# Patient Record
Sex: Female | Born: 1990 | Hispanic: No | Marital: Single | Smoking: Never smoker
Health system: Southern US, Community
[De-identification: ages and names within clinical notes are randomized; demographics above are authoritative.]

## PROBLEM LIST (undated history)

## (undated) DIAGNOSIS — J45909 Unspecified asthma, uncomplicated: Secondary | ICD-10-CM

## (undated) HISTORY — PX: NO PAST SURGERIES: SHX2092

## (undated) HISTORY — DX: Unspecified asthma, uncomplicated: J45.909

---
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
## 2017-08-14 DIAGNOSIS — Z6833 Body mass index (BMI) 33.0-33.9, adult: Secondary | ICD-10-CM | POA: Diagnosis not present

## 2017-08-14 DIAGNOSIS — Z118 Encounter for screening for other infectious and parasitic diseases: Secondary | ICD-10-CM | POA: Diagnosis not present

## 2017-08-14 DIAGNOSIS — Z01419 Encounter for gynecological examination (general) (routine) without abnormal findings: Secondary | ICD-10-CM | POA: Diagnosis not present

## 2017-08-14 DIAGNOSIS — N915 Oligomenorrhea, unspecified: Secondary | ICD-10-CM | POA: Diagnosis not present

## 2017-08-14 DIAGNOSIS — Z13220 Encounter for screening for lipoid disorders: Secondary | ICD-10-CM | POA: Diagnosis not present

## 2017-08-14 DIAGNOSIS — Z1329 Encounter for screening for other suspected endocrine disorder: Secondary | ICD-10-CM | POA: Diagnosis not present

## 2017-08-14 DIAGNOSIS — Z113 Encounter for screening for infections with a predominantly sexual mode of transmission: Secondary | ICD-10-CM | POA: Diagnosis not present

## 2017-08-14 DIAGNOSIS — E282 Polycystic ovarian syndrome: Secondary | ICD-10-CM | POA: Diagnosis not present

## 2017-08-28 DIAGNOSIS — H5213 Myopia, bilateral: Secondary | ICD-10-CM | POA: Diagnosis not present

## 2019-02-28 LAB — OB RESULTS CONSOLE HEPATITIS B SURFACE ANTIGEN: Hepatitis B Surface Ag: NEGATIVE

## 2019-02-28 LAB — OB RESULTS CONSOLE RUBELLA ANTIBODY, IGM: Rubella: IMMUNE

## 2019-05-14 DIAGNOSIS — Z3490 Encounter for supervision of normal pregnancy, unspecified, unspecified trimester: Secondary | ICD-10-CM | POA: Insufficient documentation

## 2019-05-15 ENCOUNTER — Ambulatory Visit (INDEPENDENT_AMBULATORY_CARE_PROVIDER_SITE_OTHER): Payer: Medicaid Other | Admitting: Obstetrics and Gynecology

## 2019-05-15 ENCOUNTER — Other Ambulatory Visit: Payer: Self-pay

## 2019-05-15 ENCOUNTER — Encounter: Payer: Self-pay | Admitting: Obstetrics and Gynecology

## 2019-05-15 VITALS — BP 110/69 | HR 89 | Wt 190.0 lb

## 2019-05-15 DIAGNOSIS — Z3490 Encounter for supervision of normal pregnancy, unspecified, unspecified trimester: Secondary | ICD-10-CM

## 2019-05-15 DIAGNOSIS — Z3A21 21 weeks gestation of pregnancy: Secondary | ICD-10-CM

## 2019-05-15 DIAGNOSIS — Z3402 Encounter for supervision of normal first pregnancy, second trimester: Secondary | ICD-10-CM

## 2019-05-15 DIAGNOSIS — Z3492 Encounter for supervision of normal pregnancy, unspecified, second trimester: Secondary | ICD-10-CM

## 2019-05-15 DIAGNOSIS — Z3482 Encounter for supervision of other normal pregnancy, second trimester: Secondary | ICD-10-CM

## 2019-05-15 DIAGNOSIS — R87612 Low grade squamous intraepithelial lesion on cytologic smear of cervix (LGSIL): Secondary | ICD-10-CM

## 2019-05-15 MED ORDER — BLOOD PRESSURE KIT DEVI: 1.0000 | 0 refills | Status: AC

## 2019-05-15 NOTE — Progress Notes (Signed)
Subjective:  Carmen Meza is a 28 y.o. G1P0 at [redacted]w[redacted]d being seen today for ongoing prenatal care. Transferred from [facility] d/t insurance reasons. EDD by LMP and confirmed by first trimester U/S. No chronic medical problems or medications.  She is currently monitored for the following issues for this low-risk pregnancy and has Supervision of normal pregnancy, antepartum and LGSIL of cervix of undetermined significance on their problem list.  Patient reports no complaints.  Contractions: Not present. Vag. Bleeding: None.  Movement: Present. Denies leaking of fluid.   The following portions of the patient's history were reviewed and updated as appropriate: allergies, current medications, past family history, past medical history, past social history, past surgical history and problem list. Problem list updated.  Objective:   Vitals:   05/15/19 1104  BP: 110/69  Pulse: 89  Weight: 190 lb (86.2 kg)    Fetal Status:     Movement: Present     General:  Alert, oriented and cooperative. Patient is in no acute distress.  Skin: Skin is warm and dry. No rash noted.   Cardiovascular: Normal heart rate noted  Respiratory: Normal respiratory effort, no problems with respiration noted  Abdomen: Soft, gravid, appropriate for gestational age. Pain/Pressure: Absent     Pelvic:  Cervical exam deferred        Extremities: Normal range of motion.  Edema: None  Mental Status: Normal mood and affect. Normal behavior. Normal judgment and thought content.   Urinalysis:      Assessment and Plan:  Pregnancy: G1P0 at [redacted]w[redacted]d  1. Encounter for supervision of normal pregnancy, antepartum, unspecified gravidity Prenatal care reviewed with. Prenatal labs from [place] reviewed. Do not see blood type and ABS, will obtain today Anatomy scan ordered Declined flu vaccine AFP today Normal NT at [facility] scripts and BP monitoring discussed with pt.   2. LGSIL of cervix of undetermined  significance HPV negative, repeat with co testing in 1 yr  Preterm labor symptoms and general obstetric precautions including but not limited to vaginal bleeding, contractions, leaking of fluid and fetal movement were reviewed in detail with the patient. Please refer to After Visit Summary for other counseling recommendations.  Return in about 5 weeks (around 06/19/2019) for OB visit, fasting appt for glucola.   Chancy Milroy, MD

## 2019-05-15 NOTE — Patient Instructions (Signed)

## 2019-05-15 NOTE — Addendum Note (Signed)
Addended by: Cleotilde Neer on: 05/15/2019 01:13 PM   Modules accepted: Orders

## 2019-05-17 LAB — ANTIBODY SCREEN: Antibody Screen: NEGATIVE

## 2019-05-17 LAB — AFP, SERUM, OPEN SPINA BIFIDA
AFP MoM: 1.34
AFP Value: 78.5 ng/mL
Gest. Age on Collection Date: 21.3 weeks
Maternal Age At EDD: 27.2 yr
OSBR Risk 1 IN: 4273
Test Results:: NEGATIVE
Weight: 190 [lb_av]

## 2019-06-06 ENCOUNTER — Ambulatory Visit (HOSPITAL_COMMUNITY): Payer: Medicaid Other | Admitting: *Deleted

## 2019-06-06 ENCOUNTER — Encounter (HOSPITAL_COMMUNITY): Payer: Self-pay

## 2019-06-06 ENCOUNTER — Ambulatory Visit (HOSPITAL_COMMUNITY)
Admission: RE | Admit: 2019-06-06 | Discharge: 2019-06-06 | Disposition: A | Discharge status: Home / Self Care | Payer: Medicaid Other | Source: Ambulatory Visit | Attending: Obstetrics and Gynecology | Admitting: Obstetrics and Gynecology

## 2019-06-06 ENCOUNTER — Other Ambulatory Visit: Payer: Self-pay

## 2019-06-06 ENCOUNTER — Ambulatory Visit (HOSPITAL_COMMUNITY): Payer: Medicaid Other

## 2019-06-06 VITALS — BP 113/63 | HR 79 | Temp 97.7°F | Ht 64.0 in

## 2019-06-06 DIAGNOSIS — Z3490 Encounter for supervision of normal pregnancy, unspecified, unspecified trimester: Secondary | ICD-10-CM | POA: Insufficient documentation

## 2019-06-06 DIAGNOSIS — O99212 Obesity complicating pregnancy, second trimester: Secondary | ICD-10-CM | POA: Diagnosis not present

## 2019-06-06 DIAGNOSIS — Z3A24 24 weeks gestation of pregnancy: Secondary | ICD-10-CM | POA: Diagnosis not present

## 2019-06-06 DIAGNOSIS — O0932 Supervision of pregnancy with insufficient antenatal care, second trimester: Secondary | ICD-10-CM | POA: Diagnosis not present

## 2019-06-06 DIAGNOSIS — O0990 Supervision of high risk pregnancy, unspecified, unspecified trimester: Secondary | ICD-10-CM | POA: Insufficient documentation

## 2019-06-07 NOTE — L&D Delivery Note (Addendum)
OB/GYN Faculty Practice Delivery Note  Carmen Meza is a 29 y.o. G1P0 s/p SVd at [redacted]w[redacted]d. She was admitted for IOL for post dates.  ROM: 8h 47m with clear fluid GBS Status:  Negative/-- (03/24 1037) Maximum Maternal Temperature: 100.19F  Labor Progress: . Initial SVE: 0.5/thick/-3. She was given FB, cytotec and pitocin. She was then AROM'd on 4/26 @ 1425 and IUPC inserted. She was given epidural for pain relief. During labor, she developed intrapartum Triple I and received abx. She then progressed to complete.   Delivery Date/Time: 09/30/19 at 2256 Delivery: Called to room and patient was complete and pushing. Head delivered LOA. No nuchal cord present. Shoulder and body delivered in usual fashion. Infant with spontaneous cry, placed on mother's abdomen, dried and stimulated. Cord clamped x 2 after 1-minute delay, and cut by FOB. Cord blood drawn. Placenta delivered spontaneously with gentle cord traction. Fundus firm with massage and Pitocin. Labia, perineum, vagina, and cervix inspected inspected with labial and periurethral lacerations that were sutured with 4-0 Vicryl in the usual fashion with hemostasis. Baby Weight: pending  Placenta: Sent to L&D Complications: None Lacerations: periurethral and right labial EBL: 650 mL Analgesia: Epidural   Infant:  APGAR (1 MIN): 7   APGAR (5 MINS): [address] DO, PGY1 09/30/2019, 11:17 PM  OB FELLOW DELIVERY ATTESTATION  I was gloved and present for the delivery in its entirety, and I agree with the above resident's note.    Carmen Birkenhead, MD [facility] Family Medicine Fellow, [facility] for [employer], [facility]

## 2019-06-19 ENCOUNTER — Telehealth (INDEPENDENT_AMBULATORY_CARE_PROVIDER_SITE_OTHER): Payer: Medicaid Other

## 2019-06-19 ENCOUNTER — Other Ambulatory Visit: Payer: Medicaid Other

## 2019-06-19 DIAGNOSIS — Z3403 Encounter for supervision of normal first pregnancy, third trimester: Secondary | ICD-10-CM | POA: Diagnosis not present

## 2019-06-19 DIAGNOSIS — Z3A26 26 weeks gestation of pregnancy: Secondary | ICD-10-CM

## 2019-06-19 DIAGNOSIS — Z3400 Encounter for supervision of normal first pregnancy, unspecified trimester: Secondary | ICD-10-CM

## 2019-06-19 DIAGNOSIS — Z3009 Encounter for other general counseling and advice on contraception: Secondary | ICD-10-CM

## 2019-06-19 NOTE — Progress Notes (Signed)
ROB Virtual.   CC: None

## 2019-06-19 NOTE — Patient Instructions (Signed)
Glucose Tolerance Test During Pregnancy Why am I having this test? The glucose tolerance test (GTT) is done to check how your body processes sugar (glucose). This is one of several tests used to diagnose diabetes that develops during pregnancy (gestational diabetes mellitus). Gestational diabetes is a temporary form of diabetes that some women develop during pregnancy. It usually occurs during the second trimester of pregnancy and goes away after delivery. Testing (screening) for gestational diabetes usually occurs between 24 and 28 weeks of pregnancy. You may have the GTT test after having a 1-hour glucose screening test if the results from that test indicate that you may have gestational diabetes. You may also have this test if:  You have a history of gestational diabetes.  You have a history of giving birth to very large babies or have experienced repeated fetal loss (stillbirth).  You have signs and symptoms of diabetes, such as: ? Changes in your vision. ? Tingling or numbness in your hands or feet. ? Changes in hunger, thirst, and urination that are not otherwise explained by your pregnancy. What is being tested? This test measures the amount of glucose in your blood at different times during a period of 3 hours. This indicates how well your body is able to process glucose. What kind of sample is taken?  Blood samples are required for this test. They are usually collected by inserting a needle into a blood vessel. How do I prepare for this test?  For 3 days before your test, eat normally. Have plenty of carbohydrate-rich foods.  Follow instructions from your health care provider about: ? Eating or drinking restrictions on the day of the test. You may be asked to not eat or drink anything other than water (fast) starting 8-10 hours before the test. ? Changing or stopping your regular medicines. Some medicines may interfere with this test. Tell a health care provider about:  All  medicines you are taking, including vitamins, herbs, eye drops, creams, and over-the-counter medicines.  Any blood disorders you have.  Any surgeries you have had.  Any medical conditions you have. What happens during the test? First, your blood glucose will be measured. This is referred to as your fasting blood glucose, since you fasted before the test. Then, you will drink a glucose solution that contains a certain amount of glucose. Your blood glucose will be measured again 1, 2, and 3 hours after drinking the solution. This test takes about 3 hours to complete. You will need to stay at the testing location during this time. During the testing period:  Do not eat or drink anything other than the glucose solution.  Do not exercise.  Do not use any products that contain nicotine or tobacco, such as cigarettes and e-cigarettes. If you need help stopping, ask your health care provider. The testing procedure may vary among health care providers and hospitals. How are the results reported? Your results will be reported as milligrams of glucose per deciliter of blood (mg/dL) or millimoles per liter (mmol/L). Your health care provider will compare your results to normal ranges that were established after testing a large group of people (reference ranges). Reference ranges may vary among labs and hospitals. For this test, common reference ranges are:  Fasting: less than 95-105 mg/dL (5.3-5.8 mmol/L).  1 hour after drinking glucose: less than 180-190 mg/dL (10.0-10.5 mmol/L).  2 hours after drinking glucose: less than 155-165 mg/dL (8.6-9.2 mmol/L).  3 hours after drinking glucose: 140-145 mg/dL (7.8-8.1 mmol/L). What do the   results mean? Results within reference ranges are considered normal, meaning that your glucose levels are well-controlled. If two or more of your blood glucose levels are high, you may be diagnosed with gestational diabetes. If only one level is high, your health care  provider may suggest repeat testing or other tests to confirm a diagnosis. Talk with your health care provider about what your results mean. Questions to ask your health care provider Ask your health care provider, or the department that is doing the test:  When will my results be ready?  How will I get my results?  What are my treatment options?  What other tests do I need?  What are my next steps? Summary  The glucose tolerance test (GTT) is one of several tests used to diagnose diabetes that develops during pregnancy (gestational diabetes mellitus). Gestational diabetes is a temporary form of diabetes that some women develop during pregnancy.  You may have the GTT test after having a 1-hour glucose screening test if the results from that test indicate that you may have gestational diabetes. You may also have this test if you have any symptoms or risk factors for gestational diabetes.  Talk with your health care provider about what your results mean. This information is not intended to replace advice given to you by your health care provider. Make sure you discuss any questions you have with your health care provider. Document Revised: 09/13/2018 Document Reviewed: 01/02/2017 Elsevier Patient Education  2020 Elsevier Inc.  

## 2019-06-19 NOTE — Progress Notes (Signed)
   TELEHEALTH VIRTUAL OBSTETRICS VISIT ENCOUNTER NOTE  I connected with Carmen Meza on 06/19/19 at  9:30 AM EST by telephone at home and verified that I am speaking with the correct person using two identifiers.   I discussed the limitations, risks, security and privacy concerns of performing an evaluation and management service by telephone and the availability of in person appointments. I also discussed with the patient that there may be a patient responsible charge related to this service. The patient expressed understanding and agreed to proceed.  Subjective:  Carmen Meza is a 29 y.o. G1P0 at [redacted]w[redacted]d being followed for ongoing prenatal care.  She is currently monitored for the following issues for this low-risk pregnancy and has Supervision of normal pregnancy, antepartum and LGSIL of cervix of undetermined significance on their problem list.  Patient reports no complaints. Reports fetal movement. Denies any contractions, bleeding or leaking of fluid.   The following portions of the patient's history were reviewed and updated as appropriate: allergies, current medications, past family history, past medical history, past social history, past surgical history and problem list.   Objective:   General:  Alert, oriented and cooperative.   Mental Status: Normal mood and affect perceived. Normal judgment and thought content.  Rest of physical exam deferred due to type of encounter  BP: 116/86 Assessment and Plan:  Pregnancy: G1P0 at [redacted]w[redacted]d  1. Encounter for supervision of normal first pregnancy in second trimester -Anticipatory guidance for upcoming appt.  -Reviewed Glucola appt preparation including fasting the night before and morning of.   -Discussed anticipated office time of 2.5-3 hours.  -Reviewed blood draw procedures and labs which also include check of iron level.  -Discussed how results of GTT are handled including diabetic education and BS testing for abnormal results  and routine care for normal results.  -Patient encouraged to record blood pressures into babyscripts for appropriate monitoring, notification, and management.  2. Encounter for counseling regarding contraception -States she took pills for one year with bleeding, cramping, and mood swings. -Patient reports that she does not desire birth control method after delivery.  -Expresses desire for more children, but unable to quantify.    Preterm labor symptoms and general obstetric precautions including but not limited to vaginal bleeding, contractions, leaking of fluid and fetal movement were reviewed in detail with the patient.   I discussed the assessment and treatment plan with the patient. The patient was provided an opportunity to ask questions and all were answered. The patient agreed with the plan and demonstrated an understanding of the instructions. The patient was advised to call back or seek an in-person office evaluation/go to [facility]U at [facility] for any urgent or concerning symptoms. Please refer to After Visit Summary for other counseling recommendations.   I provided 9 minutes of non-face-to-face time during this encounter.  Return in about 2 weeks (around 07/03/2019) for LR-ROB in office with GTT.  No future appointments.  Cherre Robins, CNM Center for [employer], [facility]

## 2019-06-20 ENCOUNTER — Encounter: Payer: Self-pay | Admitting: Family Medicine

## 2019-06-20 DIAGNOSIS — O99210 Obesity complicating pregnancy, unspecified trimester: Secondary | ICD-10-CM | POA: Insufficient documentation

## 2019-07-03 ENCOUNTER — Other Ambulatory Visit: Payer: Medicaid Other

## 2019-07-03 ENCOUNTER — Ambulatory Visit (INDEPENDENT_AMBULATORY_CARE_PROVIDER_SITE_OTHER): Payer: Medicaid Other | Admitting: Obstetrics

## 2019-07-03 ENCOUNTER — Other Ambulatory Visit: Payer: Self-pay

## 2019-07-03 ENCOUNTER — Encounter: Payer: Self-pay | Admitting: Obstetrics

## 2019-07-03 DIAGNOSIS — M549 Dorsalgia, unspecified: Secondary | ICD-10-CM

## 2019-07-03 DIAGNOSIS — Z3400 Encounter for supervision of normal first pregnancy, unspecified trimester: Secondary | ICD-10-CM

## 2019-07-03 DIAGNOSIS — Z3A28 28 weeks gestation of pregnancy: Secondary | ICD-10-CM

## 2019-07-03 DIAGNOSIS — Z3403 Encounter for supervision of normal first pregnancy, third trimester: Secondary | ICD-10-CM

## 2019-07-03 MED ORDER — COMFORT FIT MATERNITY SUPP SM MISC: 0 refills | Status: DC

## 2019-07-03 NOTE — Addendum Note (Signed)
Addended by: Thom Chimes on: 07/03/2019 11:40 AM   Modules accepted: Orders

## 2019-07-03 NOTE — Progress Notes (Signed)
Subjective:  Carmen Meza is a 29 y.o. G1P0 at [redacted]w[redacted]d being seen today for ongoing prenatal care.  She is currently monitored for the following issues for this low-risk pregnancy and has Supervision of normal pregnancy, antepartum; LGSIL of cervix of undetermined significance; and Obesity affecting pregnancy, antepartum on their problem list.  Patient reports backache.  Contractions: Not present.  .  Movement: Present. Denies leaking of fluid.   The following portions of the patient's history were reviewed and updated as appropriate: allergies, current medications, past family history, past medical history, past social history, past surgical history and problem list. Problem list updated.  Objective:  There were no vitals filed for this visit.  Fetal Status:     Movement: Present     General:  Alert, oriented and cooperative. Patient is in no acute distress.  Skin: Skin is warm and dry. No rash noted.   Cardiovascular: Normal heart rate noted  Respiratory: Normal respiratory effort, no problems with respiration noted  Abdomen: Soft, gravid, appropriate for gestational age. Pain/Pressure: Absent     Pelvic:  Cervical exam deferred        Extremities: Normal range of motion.  Edema: None  Mental Status: Normal mood and affect. Normal behavior. Normal judgment and thought content.   Urinalysis:      Assessment and Plan:  Pregnancy: G1P0 at [redacted]w[redacted]d  1. Supervision of normal first pregnancy, antepartum Rx: - Glucose Tolerance, 2 Hours w/1 Hour  2. Backache symptom Rx: - Elastic Bandages & Supports (COMFORT FIT MATERNITY SUPP SM) MISC; Wear as directed  Dispense: 1 each; Refill: 0   Preterm labor symptoms and general obstetric precautions including but not limited to vaginal bleeding, contractions, leaking of fluid and fetal movement were reviewed in detail with the patient. Please refer to After Visit Summary for other counseling recommendations.  No follow-ups on file.   Brock Bad, MD  07/03/2019

## 2019-07-04 LAB — CBC
Hematocrit: 34.8 % (ref 34.0–46.6)
Hemoglobin: 12.1 g/dL (ref 11.1–15.9)
MCH: 31.6 pg (ref 26.6–33.0)
MCHC: 34.8 g/dL (ref 31.5–35.7)
MCV: 91 fL (ref 79–97)
Platelets: 230 10*3/uL (ref 150–450)
RBC: 3.83 x10E6/uL (ref 3.77–5.28)
RDW: 13 % (ref 11.7–15.4)
WBC: 10.9 10*3/uL — ABNORMAL HIGH (ref 3.4–10.8)

## 2019-07-04 LAB — GLUCOSE TOLERANCE, 2 HOURS W/ 1HR
Glucose, 1 hour: 159 mg/dL (ref 65–179)
Glucose, 2 hour: 130 mg/dL (ref 65–152)
Glucose, Fasting: 78 mg/dL (ref 65–91)

## 2019-07-04 LAB — HIV ANTIBODY (ROUTINE TESTING W REFLEX): HIV Screen 4th Generation wRfx: NONREACTIVE

## 2019-07-04 LAB — RPR: RPR Ser Ql: NONREACTIVE

## 2019-07-17 ENCOUNTER — Telehealth (INDEPENDENT_AMBULATORY_CARE_PROVIDER_SITE_OTHER): Payer: Medicaid Other | Admitting: Obstetrics

## 2019-07-17 ENCOUNTER — Encounter: Payer: Self-pay | Admitting: Obstetrics

## 2019-07-17 VITALS — BP 121/82

## 2019-07-17 DIAGNOSIS — O99213 Obesity complicating pregnancy, third trimester: Secondary | ICD-10-CM

## 2019-07-17 DIAGNOSIS — Z3490 Encounter for supervision of normal pregnancy, unspecified, unspecified trimester: Secondary | ICD-10-CM

## 2019-07-17 DIAGNOSIS — Z3A30 30 weeks gestation of pregnancy: Secondary | ICD-10-CM

## 2019-07-17 DIAGNOSIS — O99210 Obesity complicating pregnancy, unspecified trimester: Secondary | ICD-10-CM

## 2019-07-17 NOTE — Progress Notes (Signed)
   TELEHEALTH OBSTETRICS PRENATAL VIRTUAL VIDEO VISIT ENCOUNTER NOTE  Provider location: Center for [facility] [place]   I connected with Carmen Meza on 07/17/19 at  3:00 PM EST by [facility] MyChart Video Encounter at home and verified that I am speaking with the correct person using two identifiers.   I discussed the limitations, risks, security and privacy concerns of performing an evaluation and management service virtually and the availability of in person appointments. I also discussed with the patient that there may be a patient responsible charge related to this service. The patient expressed understanding and agreed to proceed.  Subjective:  Carmen Meza is a 29 y.o. G1P0 at [redacted]w[redacted]d being seen today for ongoing prenatal care.  She is currently monitored for the following issues for this low-risk pregnancy and has Supervision of normal pregnancy, antepartum; LGSIL of cervix of undetermined significance; and Obesity affecting pregnancy, antepartum on their problem list.  Patient reports no complaints.  Contractions: Not present. Vag. Bleeding: None.  Movement: Present. Denies any leaking of fluid.   The following portions of the patient's history were reviewed and updated as appropriate: allergies, current medications, past family history, past medical history, past social history, past surgical history and problem list.   Objective:   Vitals:   07/17/19 1514  BP: 121/82    Fetal Status:     Movement: Present     General:  Alert, oriented and cooperative. Patient is in no acute distress.  Respiratory: Normal respiratory effort, no problems with respiration noted  Mental Status: Normal mood and affect. Normal behavior. Normal judgment and thought content.  Rest of physical exam deferred due to type of encounter  Imaging: No results found.  Assessment and Plan:  Pregnancy: G1P0 at [redacted]w[redacted]d  1. Encounter for supervision of normal pregnancy, antepartum, unspecified  gravidity  2. Obesity affecting pregnancy, antepartum   Preterm labor symptoms and general obstetric precautions including but not limited to vaginal bleeding, contractions, leaking of fluid and fetal movement were reviewed in detail with the patient. I discussed the assessment and treatment plan with the patient. The patient was provided an opportunity to ask questions and all were answered. The patient agreed with the plan and demonstrated an understanding of the instructions. The patient was advised to call back or seek an in-person office evaluation/go to MAU at [facility] for any urgent or concerning symptoms. Please refer to After Visit Summary for other counseling recommendations.   I provided 10 minutes of face-to-face time during this encounter.  Return in about 2 weeks (around 07/31/2019) for MyChart.  Future Appointments  Date Time Provider Department Center  07/31/2019  2:15 PM Brock Bad, MD [facility] None    Coral Ceo, MD Center for [facility], [facility] 07/17/2019

## 2019-07-17 NOTE — Progress Notes (Signed)
Pt is on the phone preparing for virtual visit with provider. [redacted]w[redacted]d.

## 2019-07-31 ENCOUNTER — Encounter: Payer: Self-pay | Admitting: Obstetrics

## 2019-07-31 ENCOUNTER — Telehealth (INDEPENDENT_AMBULATORY_CARE_PROVIDER_SITE_OTHER): Payer: Medicaid Other | Admitting: Obstetrics

## 2019-07-31 VITALS — BP 114/72 | HR 99

## 2019-07-31 DIAGNOSIS — O99210 Obesity complicating pregnancy, unspecified trimester: Secondary | ICD-10-CM | POA: Diagnosis not present

## 2019-07-31 DIAGNOSIS — O282 Abnormal cytological finding on antenatal screening of mother: Secondary | ICD-10-CM | POA: Diagnosis not present

## 2019-07-31 DIAGNOSIS — R87612 Low grade squamous intraepithelial lesion on cytologic smear of cervix (LGSIL): Secondary | ICD-10-CM

## 2019-07-31 DIAGNOSIS — Z3490 Encounter for supervision of normal pregnancy, unspecified, unspecified trimester: Secondary | ICD-10-CM

## 2019-07-31 DIAGNOSIS — Z3A32 32 weeks gestation of pregnancy: Secondary | ICD-10-CM | POA: Diagnosis not present

## 2019-07-31 NOTE — Progress Notes (Signed)
   TELEHEALTH VIRTUAL OBSTETRICS VISIT ENCOUNTER NOTE  I connected with Carmen Meza on 07/31/19 at  2:15 PM EST by telephone at home and verified that I am speaking with the correct person using two identifiers.   I discussed the limitations, risks, security and privacy concerns of performing an evaluation and management service by telephone and the availability of in person appointments. I also discussed with the patient that there may be a patient responsible charge related to this service. The patient expressed understanding and agreed to proceed.  Subjective:  Carmen Meza is a 29 y.o. G1P0 at [redacted]w[redacted]d being followed for ongoing prenatal care.  She is currently monitored for the following issues for this low-risk pregnancy and has Supervision of normal pregnancy, antepartum; LGSIL of cervix of undetermined significance; and Obesity affecting pregnancy, antepartum on their problem list.  Patient reports no complaints. Reports fetal movement. Denies any contractions, bleeding or leaking of fluid.   The following portions of the patient's history were reviewed and updated as appropriate: allergies, current medications, past family history, past medical history, past social history, past surgical history and problem list.   Objective:   General:  Alert, oriented and cooperative.   Mental Status: Normal mood and affect perceived. Normal judgment and thought content.  Rest of physical exam deferred due to type of encounter  Assessment and Plan:  Pregnancy: G1P0 at 107w2d  1. Encounter for supervision of normal pregnancy, antepartum, unspecified gravidity  2. 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 Obesity affecting pregnancy, antepartum   Preterm labor symptoms and general obstetric precautions including but not limited to vaginal bleeding, contractions, leaking of fluid and fetal movement were reviewed in  detail with the patient.  I discussed the assessment and treatment plan with the patient. The patient was provided an opportunity to ask questions and all were answered. The patient agreed with the plan and demonstrated an understanding of the instructions. The patient was advised to call back or seek an in-person office evaluation/go to MAU at [facility] for any urgent or concerning symptoms. Please refer to After Visit Summary for other counseling recommendations.   I provided 10 minutes of non-face-to-face time during this encounter.  Return in about 2 weeks (around 08/14/2019) for TeleHealth ( Virtual Visits don't work ).  Future Appointments  Date Time Provider Department Center  07/31/2019  2:15 PM Brock Bad, MD [facility] None    Coral Ceo, MD Center for [facility], [facility] 07/31/2019

## 2019-07-31 NOTE — Progress Notes (Signed)
S/w pt for virtual visit, pt reports fetal movement, denies pain. 

## 2019-08-14 ENCOUNTER — Encounter: Payer: Self-pay | Admitting: Obstetrics

## 2019-08-14 ENCOUNTER — Telehealth (INDEPENDENT_AMBULATORY_CARE_PROVIDER_SITE_OTHER): Payer: Medicaid Other | Admitting: Obstetrics

## 2019-08-14 DIAGNOSIS — Z3A34 34 weeks gestation of pregnancy: Secondary | ICD-10-CM

## 2019-08-14 DIAGNOSIS — O99213 Obesity complicating pregnancy, third trimester: Secondary | ICD-10-CM

## 2019-08-14 DIAGNOSIS — R87612 Low grade squamous intraepithelial lesion on cytologic smear of cervix (LGSIL): Secondary | ICD-10-CM | POA: Diagnosis not present

## 2019-08-14 DIAGNOSIS — Z3490 Encounter for supervision of normal pregnancy, unspecified, unspecified trimester: Secondary | ICD-10-CM

## 2019-08-14 DIAGNOSIS — M549 Dorsalgia, unspecified: Secondary | ICD-10-CM | POA: Diagnosis not present

## 2019-08-14 DIAGNOSIS — O99210 Obesity complicating pregnancy, unspecified trimester: Secondary | ICD-10-CM

## 2019-08-14 NOTE — Progress Notes (Signed)
   TELEHEALTH VIRTUAL OBSTETRICS VISIT ENCOUNTER NOTE  I connected with Carmen Meza on 08/14/19 at 10:30 AM EST by telephone at home and verified that I am speaking with the correct person using two identifiers.   I discussed the limitations, risks, security and privacy concerns of performing an evaluation and management service by telephone and the availability of in person appointments. I also discussed with the patient that there may be a patient responsible charge related to this service. The patient expressed understanding and agreed to proceed.  Subjective:  Carmen Meza is a 29 y.o. G1P0 at [redacted]w[redacted]d being followed for ongoing prenatal care.  She is currently monitored for the following issues for this low-risk pregnancy and has Supervision of normal pregnancy, antepartum; LGSIL of cervix of undetermined significance; and Obesity affecting pregnancy, antepartum on their problem list.  Patient reports backache. Reports fetal movement. Denies any contractions, bleeding or leaking of fluid.   The following portions of the patient's history were reviewed and updated as appropriate: allergies, current medications, past family history, past medical history, past social history, past surgical history and problem list.   Objective:   General:  Alert, oriented and cooperative.   Mental Status: Normal mood and affect perceived. Normal judgment and thought content.  Rest of physical exam deferred due to type of encounter  Assessment and Plan:  Pregnancy: G1P0 at [redacted]w[redacted]d There are no diagnoses linked to this encounter. Preterm labor symptoms and general obstetric precautions including but not limited to vaginal bleeding, contractions, leaking of fluid and fetal movement were reviewed in detail with the patient.  I discussed the assessment and treatment plan with the patient. The patient was provided an opportunity to ask questions and all were answered. The patient agreed with the plan and  demonstrated an understanding of the instructions. The patient was advised to call back or seek an in-person office evaluation/go to MAU at [facility] for any urgent or concerning symptoms. Please refer to After Visit Summary for other counseling recommendations.   I provided 10 minutes of non-face-to-face time during this encounter.    Coral Ceo, MD Center for [facility], [facility] 08/14/2019

## 2019-08-28 ENCOUNTER — Other Ambulatory Visit: Payer: Self-pay

## 2019-08-28 ENCOUNTER — Other Ambulatory Visit (HOSPITAL_COMMUNITY)
Admission: RE | Admit: 2019-08-28 | Discharge: 2019-08-28 | Disposition: A | Discharge status: Home / Self Care | Payer: Medicaid Other | Source: Ambulatory Visit | Attending: Family Medicine | Admitting: Family Medicine

## 2019-08-28 ENCOUNTER — Ambulatory Visit (INDEPENDENT_AMBULATORY_CARE_PROVIDER_SITE_OTHER): Payer: Medicaid Other | Admitting: Family Medicine

## 2019-08-28 DIAGNOSIS — Z3490 Encounter for supervision of normal pregnancy, unspecified, unspecified trimester: Secondary | ICD-10-CM | POA: Insufficient documentation

## 2019-08-28 DIAGNOSIS — Z3493 Encounter for supervision of normal pregnancy, unspecified, third trimester: Secondary | ICD-10-CM

## 2019-08-28 DIAGNOSIS — Z3A36 36 weeks gestation of pregnancy: Secondary | ICD-10-CM

## 2019-08-28 NOTE — Patient Instructions (Signed)

## 2019-08-28 NOTE — Progress Notes (Signed)
   PRENATAL VISIT NOTE  Subjective:  Carmen Meza is a 29 y.o. G1P0 at [redacted]w[redacted]d being seen today for ongoing prenatal care.  She is currently monitored for the following issues for this low-risk pregnancy and has Supervision of normal pregnancy, antepartum; LGSIL of cervix of undetermined significance; and Obesity affecting pregnancy, antepartum on their problem list.  Patient reports no complaints.  Contractions: Not present. Vag. Bleeding: None.  Movement: Present. Denies leaking of fluid.   The following portions of the patient's history were reviewed and updated as appropriate: allergies, current medications, past family history, past medical history, past social history, past surgical history and problem list.   Objective:   Vitals:   08/28/19 1015  BP: 97/66  Pulse: 89  Weight: 203 lb (92.1 kg)    Fetal Status: Fetal Heart Rate (bpm): 138 Fundal Height: 35 cm Movement: Present  Presentation: Vertex  General:  Alert, oriented and cooperative. Patient is in no acute distress.  Skin: Skin is warm and dry. No rash noted.   Cardiovascular: Normal heart rate noted  Respiratory: Normal respiratory effort, no problems with respiration noted  Abdomen: Soft, gravid, appropriate for gestational age.  Pain/Pressure: Present     Pelvic: Cervical exam performed in the presence of a chaperone Dilation: Closed Effacement (%): 0 Station: Ballotable  Extremities: Normal range of motion.  Edema: None  Mental Status: Normal mood and affect. Normal behavior. Normal judgment and thought content.   Assessment and Plan:  Pregnancy: G1P0 at [redacted]w[redacted]d 1. Encounter for supervision of normal pregnancy, antepartum, unspecified gravidity - 36 week cultures - RTC in 1 week - Girl, breast, none  Preterm labor symptoms and general obstetric precautions including but not limited to vaginal bleeding, contractions, leaking of fluid and fetal movement were reviewed in detail with the patient. Please refer to  After Visit Summary for other counseling recommendations.   Return in about 1 week (around 09/04/2019) for ROB: virtual okay .  No future appointments.  Joselyn Arrow, MD

## 2019-08-28 NOTE — Progress Notes (Signed)
Patient reports fetal movement and pressure, denies contractions. 

## 2019-08-29 LAB — CERVICOVAGINAL ANCILLARY ONLY
Chlamydia: NEGATIVE
Comment: NEGATIVE
Comment: NORMAL
Neisseria Gonorrhea: NEGATIVE

## 2019-08-30 LAB — STREP GP B NAA: Strep Gp B NAA: NEGATIVE

## 2019-09-04 ENCOUNTER — Telehealth (INDEPENDENT_AMBULATORY_CARE_PROVIDER_SITE_OTHER): Payer: Medicaid Other | Admitting: Medical

## 2019-09-04 ENCOUNTER — Encounter: Payer: Self-pay | Admitting: Medical

## 2019-09-04 VITALS — BP 113/76 | HR 88

## 2019-09-04 DIAGNOSIS — O99210 Obesity complicating pregnancy, unspecified trimester: Secondary | ICD-10-CM | POA: Diagnosis not present

## 2019-09-04 DIAGNOSIS — O3433 Maternal care for cervical incompetence, third trimester: Secondary | ICD-10-CM

## 2019-09-04 DIAGNOSIS — Z3490 Encounter for supervision of normal pregnancy, unspecified, unspecified trimester: Secondary | ICD-10-CM

## 2019-09-04 DIAGNOSIS — R87612 Low grade squamous intraepithelial lesion on cytologic smear of cervix (LGSIL): Secondary | ICD-10-CM

## 2019-09-04 DIAGNOSIS — E669 Obesity, unspecified: Secondary | ICD-10-CM

## 2019-09-04 DIAGNOSIS — Z3A37 37 weeks gestation of pregnancy: Secondary | ICD-10-CM

## 2019-09-04 NOTE — Progress Notes (Signed)
Virtual ROB  GBS and GC/CT  Neg on 08/28/19  CC: None  *Pt able to take vitals while on phone.   B/P:113/76  P:88

## 2019-09-04 NOTE — Progress Notes (Signed)
I connected with Carmen Meza on 09/04/19 at 10:55 AM EDT by: MyChart and verified that I am speaking with the correct person using two identifiers.  Patient is located at home and provider is located at [facility].     The purpose of this virtual visit is to provide medical care while limiting exposure to the novel coronavirus. I discussed the limitations, risks, security and privacy concerns of performing an evaluation and management service by MyChart and the availability of in person appointments. I also discussed with the patient that there may be a patient responsible charge related to this service. By engaging in this virtual visit, you consent to the provision of healthcare.  Additionally, you authorize for your insurance to be billed for the services provided during this visit.  The patient expressed understanding and agreed to proceed.  The following staff members participated in the virtual visit:  Corky Crafts, CMA    PRENATAL VISIT NOTE  Subjective:  Carmen Meza is a 29 y.o. G1P0 at [redacted]w[redacted]d  for phone visit for ongoing prenatal care.  She is currently monitored for the following issues for this low-risk pregnancy and has Supervision of normal pregnancy, antepartum; LGSIL of cervix of undetermined significance; and Obesity affecting pregnancy, antepartum on their problem list.  Patient reports no complaints.  Contractions: Not present. Vag. Bleeding: None.  Movement: Present. Denies leaking of fluid.   The following portions of the patient's history were reviewed and updated as appropriate: allergies, current medications, past family history, past medical history, past social history, past surgical history and problem list.   Objective:   Vitals:   09/04/19 1104  BP: 113/76  Pulse: 88   Self-Obtained  Fetal Status:     Movement: Present     Assessment and Plan:  Pregnancy: G1P0 at [redacted]w[redacted]d 1. Obesity affecting pregnancy, antepartum  2. Encounter for supervision of  normal pregnancy, antepartum, unspecified gravidity - GBS and GC/Chlamydia normal at last visit, discussed with patient  - Anticipatory guidance for future visits given   3. LGSIL of cervix of undetermined significance - Follow-up in 1 year   Term labor symptoms and general obstetric precautions including but not limited to vaginal bleeding, contractions, leaking of fluid and fetal movement were reviewed in detail with the patient.  Return in about 1 week (around 09/11/2019) for LOB, Virtual.  No future appointments.   Time spent on virtual visit: 8 minutes  Vonzella Nipple, PA-C

## 2019-09-04 NOTE — Patient Instructions (Addendum)
Fetal Movement Counts Patient Name: ________________________________________________ Patient Due Date: ____________________ What is a fetal movement count?  A fetal movement count is the number of times that you feel your baby move during a certain amount of time. This may also be called a fetal kick count. A fetal movement count is recommended for every pregnant woman. You may be asked to start counting fetal movements as early as week 28 of your pregnancy. Pay attention to when your baby is most active. You may notice your baby's sleep and wake cycles. You may also notice things that make your baby move more. You should do a fetal movement count:  When your baby is normally most active.  At the same time each day. A good time to count movements is while you are resting, after having something to eat and drink. How do I count fetal movements? 1. Find a quiet, comfortable area. Sit, or lie down on your side. 2. Write down the date, the start time and stop time, and the number of movements that you felt between those two times. Take this information with you to your health care visits. 3. Write down your start time when you feel the first movement. 4. Count kicks, flutters, swishes, rolls, and jabs. You should feel at least 10 movements. 5. You may stop counting after you have felt 10 movements, or if you have been counting for 2 hours. Write down the stop time. 6. If you do not feel 10 movements in 2 hours, contact your health care provider for further instructions. Your health care provider may want to do additional tests to assess your baby's well-being. Contact a health care provider if:  You feel fewer than 10 movements in 2 hours.  Your baby is not moving like he or she usually does. Date: ____________ Start time: ____________ Stop time: ____________ Movements: ____________ Date: ____________ Start time: ____________ Stop time: ____________ Movements: ____________ Date: ____________  Start time: ____________ Stop time: ____________ Movements: ____________ Date: ____________ Start time: ____________ Stop time: ____________ Movements: ____________ Date: ____________ Start time: ____________ Stop time: ____________ Movements: ____________ Date: ____________ Start time: ____________ Stop time: ____________ Movements: ____________ Date: ____________ Start time: ____________ Stop time: ____________ Movements: ____________ Date: ____________ Start time: ____________ Stop time: ____________ Movements: ____________ Date: ____________ Start time: ____________ Stop time: ____________ Movements: ____________ This information is not intended to replace advice given to you by your health care provider. Make sure you discuss any questions you have with your health care provider. Document Revised: 01/10/2019 Document Reviewed: 01/10/2019 Elsevier Patient Education  2020 [employer] Braxton Hicks Contractions Contractions of the uterus can occur throughout pregnancy, but they are not always a sign that you are in labor. You may have practice contractions called Braxton Hicks contractions. These false labor contractions are sometimes confused with true labor. What are Braxton Hicks contractions? Braxton Hicks contractions are tightening movements that occur in the muscles of the uterus before labor. Unlike true labor contractions, these contractions do not result in opening (dilation) and thinning of the cervix. Toward the end of pregnancy (32-34 weeks), Braxton Hicks contractions can happen more often and may become stronger. These contractions are sometimes difficult to tell apart from true labor because they can be very uncomfortable. You should not feel embarrassed if you go to the hospital with false labor. Sometimes, the only way to tell if you are in true labor is for your health care provider to look for changes in the cervix. The health care provider   will do a physical exam and may  monitor your contractions. If you are not in true labor, the exam should show that your cervix is not dilating and your water has not broken. If there are no other health problems associated with your pregnancy, it is completely safe for you to be sent home with false labor. You may continue to have Braxton Hicks contractions until you go into true labor. How to tell the difference between true labor and false labor True labor  Contractions last 30-70 seconds.  Contractions become very regular.  Discomfort is usually felt in the top of the uterus, and it spreads to the lower abdomen and low back.  Contractions do not go away with walking.  Contractions usually become more intense and increase in frequency.  The cervix dilates and gets thinner. False labor  Contractions are usually shorter and not as strong as true labor contractions.  Contractions are usually irregular.  Contractions are often felt in the front of the lower abdomen and in the groin.  Contractions may go away when you walk around or change positions while lying down.  Contractions get weaker and are shorter-lasting as time goes on.  The cervix usually does not dilate or become thin. Follow these instructions at home:   Take over-the-counter and prescription medicines only as told by your health care provider.  Keep up with your usual exercises and follow other instructions from your health care provider.  Eat and drink lightly if you think you are going into labor.  If Braxton Hicks contractions are making you uncomfortable: ? Change your position from lying down or resting to walking, or change from walking to resting. ? Sit and rest in a tub of warm water. ? Drink enough fluid to keep your urine pale yellow. Dehydration may cause these contractions. ? Do slow and deep breathing several times an hour.  Keep all follow-up prenatal visits as told by your health care provider. This is important. Contact a  health care provider if:  You have a fever.  You have continuous pain in your abdomen. Get help right away if:  Your contractions become stronger, more regular, and closer together.  You have fluid leaking or gushing from your vagina.  You pass blood-tinged mucus (bloody show).  You have bleeding from your vagina.  You have low back pain that you never had before.  You feel your baby's head pushing down and causing pelvic pressure.  Your baby is not moving inside you as much as it used to. Summary  Contractions that occur before labor are called Braxton Hicks contractions, false labor, or practice contractions.  Braxton Hicks contractions are usually shorter, weaker, farther apart, and less regular than true labor contractions. True labor contractions usually become progressively stronger and regular, and they become more frequent.  Manage discomfort from Braxton Hicks contractions by changing position, resting in a warm bath, drinking plenty of water, or practicing deep breathing. This information is not intended to replace advice given to you by your health care provider. Make sure you discuss any questions you have with your health care provider. Document Revised: 05/05/2017 Document Reviewed: 10/06/2016 Elsevier Patient Education  2020 Elsevier Inc[employer]  

## 2019-09-10 ENCOUNTER — Encounter: Payer: Self-pay | Admitting: Women's Health

## 2019-09-10 ENCOUNTER — Telehealth (INDEPENDENT_AMBULATORY_CARE_PROVIDER_SITE_OTHER): Payer: Medicaid Other | Admitting: Women's Health

## 2019-09-10 VITALS — BP 137/89 | HR 90

## 2019-09-10 DIAGNOSIS — O99210 Obesity complicating pregnancy, unspecified trimester: Secondary | ICD-10-CM

## 2019-09-10 DIAGNOSIS — O99213 Obesity complicating pregnancy, third trimester: Secondary | ICD-10-CM | POA: Diagnosis not present

## 2019-09-10 DIAGNOSIS — R87612 Low grade squamous intraepithelial lesion on cytologic smear of cervix (LGSIL): Secondary | ICD-10-CM | POA: Diagnosis not present

## 2019-09-10 DIAGNOSIS — Z3490 Encounter for supervision of normal pregnancy, unspecified, unspecified trimester: Secondary | ICD-10-CM

## 2019-09-10 DIAGNOSIS — Z3A38 38 weeks gestation of pregnancy: Secondary | ICD-10-CM

## 2019-09-10 NOTE — Progress Notes (Signed)
I connected with Thornton Papas. Doi on 09/10/19 at 10:55 AM EDT by: MyChart and verified that I am speaking with the correct person using two identifiers.  Patient is located at home and provider is located at [facility].     The purpose of this virtual visit is to provide medical care while limiting exposure to the novel coronavirus. I discussed the limitations, risks, security and privacy concerns of performing an evaluation and management service by MyChart and the availability of in person appointments. I also discussed with the patient that there may be a patient responsible charge related to this service. By engaging in this virtual visit, you consent to the provision of healthcare.  Additionally, you authorize for your insurance to be billed for the services provided during this visit.  The patient expressed understanding and agreed to proceed.  The following staff members participated in the virtual visit: Donia Ast  -  PRENATAL VISIT NOTE  Subjective:  Carmen Meza is a 29 y.o. G1P0 at [redacted]w[redacted]d  for phone visit for ongoing prenatal care.  She is currently monitored for the following issues for this low-risk pregnancy and has Supervision of normal pregnancy, antepartum; LGSIL of cervix of undetermined significance; and Obesity affecting pregnancy, antepartum on their problem list.  Patient reports no complaints.  Contractions: Not present. Vag. Bleeding: None.  Movement: Present. Denies leaking of fluid.   The following portions of the patient's history were reviewed and updated as appropriate: allergies, current medications, past family history, past medical history, past social history, past surgical history and problem list.   Objective:   Vitals:   09/10/19 1015  BP: 137/89  Pulse: 90   Self-Obtained  Fetal Status:     Movement: Present     Assessment and Plan:  Pregnancy: G1P0 at [redacted]w[redacted]d  1. Encounter for supervision of normal pregnancy, antepartum, unspecified  gravidity -discussed contraception, pt elects none, information given -discussed s/sx of preeclampsia, pt to check BP this afternoon and call clinic if 140/90 or higher -schedule IOL next visit  2. Obesity affecting pregnancy, antepartum  3. LGSIL of cervix of undetermined significance -needs repeat Pap 05/2020, pt aware  I discussed the assessment and treatment plan with the patient. The patient was provided an opportunity to ask questions and all were answered. The patient agreed with the plan and demonstrated an understanding of the instructions. The patient was advised to call back or seek an in-person office evaluation/go to [facility] at [facility] for any urgent or concerning symptoms. Term labor symptoms and general obstetric precautions including but not limited to vaginal bleeding, contractions, leaking of fluid and fetal movement were reviewed in detail with the patient.  Return in about 1 week (around 09/17/2019) for virtual ROB.  Future Appointments  Date Time Provider Department Center  09/10/2019 10:55 AM Selma Mink, Odie Sera, NP [facility] None     Time spent on virtual visit: 14 minutes  Marylen Ponto, NP

## 2019-09-10 NOTE — Progress Notes (Signed)
Virtual Visit via Telephone Note  I connected with Carmen Meza on 09/10/19 at 10:55 AM EDT by telephone and verified that I am speaking with the correct person using two identifiers.  ROB w/o complaints today

## 2019-09-10 NOTE — Patient Instructions (Addendum)
Maternity Assessment Unit (MAU)  The Maternity Assessment Unit (MAU) is located at the [facility] at [facility]. The address is: [address] [place], [place], [place] [address]. Please see map below for additional directions.    The Maternity Assessment Unit is designed to help you during your pregnancy, and for up to 6 weeks after delivery, with any pregnancy- or postpartum-related emergencies, if you think you are in labor, or if your water has broken. For example, if you experience nausea and vomiting, vaginal bleeding, severe abdominal or pelvic pain, elevated blood pressure or other problems related to your pregnancy or postpartum time, please come to the Maternity Assessment Unit for assistance.   Contraception Choices Contraception, also called birth control, refers to methods or devices that prevent pregnancy. Hormonal methods Contraceptive implant  A contraceptive implant is a thin, plastic tube that contains a hormone. It is inserted into the upper part of the arm. It can remain in place for up to 3 years. Progestin-only injections Progestin-only injections are injections of progestin, a synthetic form of the hormone progesterone. They are given every 3 months by a health care provider. Birth control pills  Birth control pills are pills that contain hormones that prevent pregnancy. They must be taken once a day, preferably at the same time each day. Birth control patch  The birth control patch contains hormones that prevent pregnancy. It is placed on the skin and must be changed once a week for three weeks and removed on the fourth week. A prescription is needed to use this method of contraception. Vaginal ring  A vaginal ring contains hormones that prevent pregnancy. It is placed in the vagina for three weeks and removed on the fourth week. After that, the process is repeated with a new ring. A prescription is needed to use this method of  contraception. Emergency contraceptive Emergency contraceptives prevent pregnancy after unprotected sex. They come in pill form and can be taken up to 5 days after sex. They work best the sooner they are taken after having sex. Most emergency contraceptives are available without a prescription. This method should not be used as your only form of birth control. Barrier methods Female condom  A female condom is a thin sheath that is worn over the penis during sex. Condoms keep sperm from going inside a woman's body. They can be used with a spermicide to increase their effectiveness. They should be disposed after a single use. Female condom  A female condom is a soft, loose-fitting sheath that is put into the vagina before sex. The condom keeps sperm from going inside a woman's body. They should be disposed after a single use. Diaphragm  A diaphragm is a soft, dome-shaped barrier. It is inserted into the vagina before sex, along with a spermicide. The diaphragm blocks sperm from entering the uterus, and the spermicide kills sperm. A diaphragm should be left in the vagina for 6-8 hours after sex and removed within 24 hours. A diaphragm is prescribed and fitted by a health care provider. A diaphragm should be replaced every 1-2 years, after giving birth, after gaining more than 15 lb (6.8 kg), and after pelvic surgery. Cervical cap  A cervical cap is a round, soft latex or plastic cup that fits over the cervix. It is inserted into the vagina before sex, along with spermicide. It blocks sperm from entering the uterus. The cap should be left in place for 6-8 hours after sex and removed within  48 hours. A cervical cap must be prescribed and fitted by a health care provider. It should be replaced every 2 years. Sponge  A sponge is a soft, circular piece of polyurethane foam with spermicide on it. The sponge helps block sperm from entering the uterus, and the spermicide kills sperm. To use it, you make it wet  and then insert it into the vagina. It should be inserted before sex, left in for at least 6 hours after sex, and removed and thrown away within 30 hours. Spermicides Spermicides are chemicals that kill or block sperm from entering the cervix and uterus. They can come as a cream, jelly, suppository, foam, or tablet. A spermicide should be inserted into the vagina with an applicator at least 10-15 minutes before sex to allow time for it to work. The process must be repeated every time you have sex. Spermicides do not require a prescription. Intrauterine contraception Intrauterine device (IUD) An IUD is a T-shaped device that is put in a woman's uterus. There are two types:  Hormone IUD.This type contains progestin, a synthetic form of the hormone progesterone. This type can stay in place for 3-5 years.  Copper IUD.This type is wrapped in copper wire. It can stay in place for 10 years.  Permanent methods of contraception Female tubal ligation In this method, a woman's fallopian tubes are sealed, tied, or blocked during surgery to prevent eggs from traveling to the uterus. Hysteroscopic sterilization In this method, a small, flexible insert is placed into each fallopian tube. The inserts cause scar tissue to form in the fallopian tubes and block them, so sperm cannot reach an egg. The procedure takes about 3 months to be effective. Another form of birth control must be used during those 3 months. Female sterilization This is a procedure to tie off the tubes that carry sperm (vasectomy). After the procedure, the man can still ejaculate fluid (semen). Natural planning methods Natural family planning In this method, a couple does not have sex on days when the woman could become pregnant. Calendar method This means keeping track of the length of each menstrual cycle, identifying the days when pregnancy can happen, and not having sex on those days. Ovulation method In this method, a couple avoids sex  during ovulation. Symptothermal method This method involves not having sex during ovulation. The woman typically checks for ovulation by watching changes in her temperature and in the consistency of cervical mucus. Post-ovulation method In this method, a couple waits to have sex until after ovulation. Summary  Contraception, also called birth control, means methods or devices that prevent pregnancy.  Hormonal methods of contraception include implants, injections, pills, patches, vaginal rings, and emergency contraceptives.  Barrier methods of contraception can include female condoms, female condoms, diaphragms, cervical caps, sponges, and spermicides.  There are two types of IUDs (intrauterine devices). An IUD can be put in a woman's uterus to prevent pregnancy for 3-5 years.  Permanent sterilization can be done through a procedure for males, females, or both.  Natural family planning methods involve not having sex on days when the woman could become pregnant. This information is not intended to replace advice given to you by your health care provider. Make sure you discuss any questions you have with your health care provider. Document Revised: 05/25/2017 Document Reviewed: 06/25/2016 Elsevier Patient Education  2020 [employer].    Signs and Symptoms of Labor Labor is your body's natural process of moving your baby, placenta, and umbilical cord out of  your uterus. The process of labor usually starts when your baby is full-term, between 96 and 40 weeks of pregnancy. How will I know when I am close to going into labor? As your body prepares for labor and the birth of your baby, you may notice the following symptoms in the weeks and days before true labor starts:  Having a strong desire to get your home ready to receive your new baby. This is called nesting. Nesting may be a sign that labor is approaching, and it may occur several weeks before birth. Nesting may involve cleaning and  organizing your home.  Passing a small amount of thick, bloody mucus out of your vagina (normal bloody show or losing your mucus plug). This may happen more than a week before labor begins, or it might occur right before labor begins as the opening of the cervix starts to widen (dilate). For some women, the entire mucus plug passes at once. For others, smaller portions of the mucus plug may gradually pass over several days.  Your baby moving (dropping) lower in your pelvis to get into position for birth (lightening). When this happens, you may feel more pressure on your bladder and pelvic bone and less pressure on your ribs. This may make it easier to breathe. It may also cause you to need to urinate more often and have problems with bowel movements.  Having "practice contractions" (Braxton Hicks contractions) that occur at irregular (unevenly spaced) intervals that are more than 10 minutes apart. This is also called false labor. False labor contractions are common after exercise or sexual activity, and they will stop if you change position, rest, or drink fluids. These contractions are usually mild and do not get stronger over time. They may feel like: ? A backache or back pain. ? Mild cramps, similar to menstrual cramps. ? Tightening or pressure in your abdomen. Other early symptoms that labor may be starting soon include:  Nausea or loss of appetite.  Diarrhea.  Having a sudden burst of energy, or feeling very tired.  Mood changes.  Having trouble sleeping. How will I know when labor has begun? Signs that true labor has begun may include:  Having contractions that come at regular (evenly spaced) intervals and increase in intensity. This may feel like more intense tightening or pressure in your abdomen that moves to your back. ? Contractions may also feel like rhythmic pain in your upper thighs or back that comes and goes at regular intervals. ? For first-time mothers, this change in  intensity of contractions often occurs at a more gradual pace. ? Women who have given birth before may notice a more rapid progression of contraction changes.  Having a feeling of pressure in the vaginal area.  Your water breaking (rupture of membranes). This is when the sac of fluid that surrounds your baby breaks. When this happens, you will notice fluid leaking from your vagina. This may be clear or blood-tinged. Labor usually starts within 24 hours of your water breaking, but it may take longer to begin. ? Some women notice this as a gush of fluid. ? Others notice that their underwear repeatedly becomes damp. Follow these instructions at home:   When labor starts, or if your water breaks, call your health care provider or nurse care line. Based on your situation, they will determine when you should go in for an exam.  When you are in early labor, you may be able to rest and manage symptoms at home. Some  strategies to try at home include: ? Breathing and relaxation techniques. ? Taking a warm bath or shower. ? Listening to music. ? Using a heating pad on the lower back for pain. If you are directed to use heat:  Place a towel between your skin and the heat source.  Leave the heat on for 20-30 minutes.  Remove the heat if your skin turns bright red. This is especially important if you are unable to feel pain, heat, or cold. You may have a greater risk of getting burned. Get help right away if:  You have painful, regular contractions that are 5 minutes apart or less.  Labor starts before you are [redacted] weeks along in your pregnancy.  You have a fever.  You have a headache that does not go away.  You have bright red blood coming from your vagina.  You do not feel your baby moving.  You have a sudden onset of: ? Severe headache with vision problems. ? Nausea, vomiting, or diarrhea. ? Chest pain or shortness of breath. These symptoms may be an emergency. If your health care  provider recommends that you go to the hospital or birth center where you plan to deliver, do not drive yourself. Have someone else drive you, or call emergency services (911 in the [place] Summary  Labor is your body's natural process of moving your baby, placenta, and umbilical cord out of your uterus.  The process of labor usually starts when your baby is full-term, between 5237 and 40 weeks of pregnancy.  When labor starts, or if your water breaks, call your health care provider or nurse care line. Based on your situation, they will determine when you should go in for an exam. This information is not intended to replace advice given to you by your health care provider. Make sure you discuss any questions you have with your health care provider. Document Revised: 02/20/2017 Document Reviewed: 10/28/2016 Elsevier Patient Education  2020 Else[employer] Inc[employer]   Preeclampsia and Eclampsia Preeclampsia is a serious condition that may develop during pregnancy. This condition causes high blood pressure and increased protein in your urine along with other symptoms, such as headaches and vision changes. These symptoms may develop as the condition gets worse. Preeclampsia may occur at 20 weeks of pregnancy or later. Diagnosing and treating preeclampsia early is very important. If not treated early, it can cause serious problems for you and your baby. One problem it can lead to is eclampsia. Eclampsia is a condition that causes muscle jerking or shaking (convulsions or seizures) and other serious problems for the mother. During pregnancy, delivering your baby may be the best treatment for preeclampsia or eclampsia. For most women, preeclampsia and eclampsia symptoms go away after giving birth. In rare cases, a woman may develop preeclampsia after giving birth (postpartum preeclampsia). This usually occurs within 48 hours after childbirth but may occur up to 6 weeks after giving birth. What are the causes? The cause  of preeclampsia is not known. What increases the risk? The following risk factors make you more likely to develop preeclampsia:  Being pregnant for the first time.  Having had preeclampsia during a past pregnancy.  Having a family history of preeclampsia.  Having high blood pressure.  Being pregnant with more than one baby.  Being 3935 or older.  Being African-American.  Having kidney disease or diabetes.  Having medical conditions such as lupus or blood diseases.  Being very overweight (obese). What are the signs or symptoms? The most  common symptoms are:  Severe headaches.  Vision problems, such as blurred or double vision.  Abdominal pain, especially upper abdominal pain. Other symptoms that may develop as the condition gets worse include:  Sudden weight gain.  Sudden swelling of the hands, face, legs, and feet.  Severe nausea and vomiting.  Numbness in the face, arms, legs, and feet.  Dizziness.  Urinating less than usual.  Slurred speech.  Convulsions or seizures. How is this diagnosed? There are no screening tests for preeclampsia. Your health care provider will ask you about symptoms and check for signs of preeclampsia during your prenatal visits. You may also have tests that include:  Checking your blood pressure.  Urine tests to check for protein. Your health care provider will check for this at every prenatal visit.  Blood tests.  Monitoring your baby's heart rate.  Ultrasound. How is this treated? You and your health care provider will determine the treatment approach that is best for you. Treatment may include:  Having more frequent prenatal exams to check for signs of preeclampsia, if you have an increased risk for preeclampsia.  Medicine to lower your blood pressure.  Staying in the hospital, if your condition is severe. There, treatment will focus on controlling your blood pressure and the amount of fluids in your body (fluid  retention).  Taking medicine (magnesium sulfate) to prevent seizures. This may be given as an injection or through an IV.  Taking a low-dose aspirin during your pregnancy.  Delivering your baby early. You may have your labor started with medicine (induced), or you may have a cesarean delivery. Follow these instructions at home: Eating and drinking   Drink enough fluid to keep your urine pale yellow.  Avoid caffeine. Lifestyle  Do not use any products that contain nicotine or tobacco, such as cigarettes and e-cigarettes. If you need help quitting, ask your health care provider.  Do not use alcohol or drugs.  Avoid stress as much as possible. Rest and get plenty of sleep. General instructions  Take over-the-counter and prescription medicines only as told by your health care provider.  When lying down, lie on your left side. This keeps pressure off your major blood vessels.  When sitting or lying down, raise (elevate) your feet. Try putting some pillows underneath your lower legs.  Exercise regularly. Ask your health care provider what kinds of exercise are best for you.  Keep all follow-up and prenatal visits as told by your health care provider. This is important. How is this prevented? There is no known way of preventing preeclampsia or eclampsia from developing. However, to lower your risk of complications and detect problems early:  Get regular prenatal care. Your health care provider may be able to diagnose and treat the condition early.  Maintain a healthy weight. Ask your health care provider for help managing weight gain during pregnancy.  Work with your health care provider to manage any long-term (chronic) health conditions you have, such as diabetes or kidney problems.  You may have tests of your blood pressure and kidney function after giving birth.  Your health care provider may have you take low-dose aspirin during your next pregnancy. Contact a health care  provider if:  You have symptoms that your health care provider told you may require more treatment or monitoring, such as: ? Headaches. ? Nausea or vomiting. ? Abdominal pain. ? Dizziness. ? Light-headedness. Get help right away if:  You have severe: ? Abdominal pain. ? Headaches that do not get  better. ? Dizziness. ? Vision problems. ? Confusion. ? Nausea or vomiting.  You have any of the following: ? A seizure. ? Sudden, rapid weight gain. ? Sudden swelling in your hands, ankles, or face. ? Trouble moving any part of your body. ? Numbness in any part of your body. ? Trouble speaking. ? Abnormal bleeding.  You faint. Summary  Preeclampsia is a serious condition that may develop during pregnancy.  This condition causes high blood pressure and increased protein in your urine along with other symptoms, such as headaches and vision changes.  Diagnosing and treating preeclampsia early is very important. If not treated early, it can cause serious problems for you and your baby.  Get help right away if you have symptoms that your health care provider told you to watch for. This information is not intended to replace advice given to you by your health care provider. Make sure you discuss any questions you have with your health care provider. Document Revised: 01/23/2018 Document Reviewed: 12/28/2015 Elsevier Patient Education  [place].

## 2019-09-17 ENCOUNTER — Encounter (HOSPITAL_COMMUNITY): Payer: Self-pay | Admitting: Obstetrics and Gynecology

## 2019-09-17 ENCOUNTER — Telehealth (INDEPENDENT_AMBULATORY_CARE_PROVIDER_SITE_OTHER): Payer: Medicaid Other | Admitting: Obstetrics

## 2019-09-17 ENCOUNTER — Encounter: Payer: Self-pay | Admitting: Obstetrics

## 2019-09-17 ENCOUNTER — Inpatient Hospital Stay (HOSPITAL_COMMUNITY)
Admission: AD | Admit: 2019-09-17 | Discharge: 2019-09-17 | Disposition: A | Discharge status: Home / Self Care | Payer: Medicaid Other | Attending: Obstetrics and Gynecology | Admitting: Obstetrics and Gynecology

## 2019-09-17 ENCOUNTER — Other Ambulatory Visit: Payer: Self-pay

## 2019-09-17 VITALS — BP 133/93 | HR 88

## 2019-09-17 DIAGNOSIS — Z3A39 39 weeks gestation of pregnancy: Secondary | ICD-10-CM

## 2019-09-17 DIAGNOSIS — O99891 Other specified diseases and conditions complicating pregnancy: Secondary | ICD-10-CM | POA: Diagnosis not present

## 2019-09-17 DIAGNOSIS — O99213 Obesity complicating pregnancy, third trimester: Secondary | ICD-10-CM

## 2019-09-17 DIAGNOSIS — O26893 Other specified pregnancy related conditions, third trimester: Secondary | ICD-10-CM | POA: Insufficient documentation

## 2019-09-17 DIAGNOSIS — E669 Obesity, unspecified: Secondary | ICD-10-CM | POA: Diagnosis not present

## 2019-09-17 DIAGNOSIS — R030 Elevated blood-pressure reading, without diagnosis of hypertension: Secondary | ICD-10-CM | POA: Insufficient documentation

## 2019-09-17 DIAGNOSIS — O99210 Obesity complicating pregnancy, unspecified trimester: Secondary | ICD-10-CM

## 2019-09-17 DIAGNOSIS — Z3490 Encounter for supervision of normal pregnancy, unspecified, unspecified trimester: Secondary | ICD-10-CM

## 2019-09-17 LAB — PROTEIN / CREATININE RATIO, URINE
Creatinine, Urine: 133.94 mg/dL
Protein Creatinine Ratio: 0.08 mg/mg{Cre} (ref 0.00–0.15)
Total Protein, Urine: 11 mg/dL

## 2019-09-17 LAB — URINALYSIS, ROUTINE W REFLEX MICROSCOPIC
Bilirubin Urine: NEGATIVE
Glucose, UA: NEGATIVE mg/dL
Hgb urine dipstick: NEGATIVE
Ketones, ur: NEGATIVE mg/dL
Leukocytes,Ua: NEGATIVE
Nitrite: NEGATIVE
Protein, ur: NEGATIVE mg/dL
Specific Gravity, Urine: 1.015 (ref 1.005–1.030)
pH: 7 (ref 5.0–8.0)

## 2019-09-17 LAB — COMPREHENSIVE METABOLIC PANEL
ALT: 16 U/L (ref 0–44)
AST: 20 U/L (ref 15–41)
Albumin: 3.1 g/dL — ABNORMAL LOW (ref 3.5–5.0)
Alkaline Phosphatase: 86 U/L (ref 38–126)
Anion gap: 9 (ref 5–15)
BUN: <5 mg/dL — ABNORMAL LOW (ref 6–20)
CO2: 23 mmol/L (ref 22–32)
Calcium: 9 mg/dL (ref 8.9–10.3)
Chloride: 106 mmol/L (ref 98–111)
Creatinine, Ser: 0.7 mg/dL (ref 0.44–1.00)
GFR calc Af Amer: >60 mL/min (ref >60)
GFR calc non Af Amer: >60 mL/min (ref >60)
Glucose, Bld: 101 mg/dL — ABNORMAL HIGH (ref 70–99)
Potassium: 3.4 mmol/L — ABNORMAL LOW (ref 3.5–5.1)
Sodium: 138 mmol/L (ref 135–145)
Total Bilirubin: 0.6 mg/dL (ref 0.3–1.2)
Total Protein: 6 g/dL — ABNORMAL LOW (ref 6.5–8.1)

## 2019-09-17 LAB — CBC
HCT: 36 % (ref 36.0–46.0)
Hemoglobin: 12 g/dL (ref 12.0–15.0)
MCH: 30.7 pg (ref 26.0–34.0)
MCHC: 33.3 g/dL (ref 30.0–36.0)
MCV: 92.1 fL (ref 80.0–100.0)
Platelets: 208 10*3/uL (ref 150–400)
RBC: 3.91 MIL/uL (ref 3.87–5.11)
RDW: 13.8 % (ref 11.5–15.5)
WBC: 12.3 10*3/uL — ABNORMAL HIGH (ref 4.0–10.5)
nRBC: 0 % (ref 0.0–0.2)

## 2019-09-17 NOTE — MAU Provider Note (Signed)
Chief Complaint  Patient presents with  . Hypertension  . Decreased Fetal Movement     First Provider Initiated Contact with Patient 09/17/19 1248      S: Carmen Meza  is a 29 y.o. y.o. year old G1P0 female at [redacted]w[redacted]d weeks gestation who presents to MAU with elevated blood pressures. Denies Hx of hypertension. Current blood pressure medication: none.  Had virtual visit with [facility] this morning & using her home cuff her BP was elevated. Denies any symptoms. Also reports some decreased movement this morning but reports movement is improving since arrival to MAU.   Associated symptoms: denies Headache, denies vision changes, denies epigastric pain Contractions: denies  Vaginal bleeding: denies  Fetal movement: decrease  O:  Patient Vitals for the past 24 hrs:  BP Temp Temp src Pulse Resp SpO2  09/17/19 1401 111/68 -- -- 85 -- 99 %  09/17/19 1346 117/66 -- -- 86 -- 99 %  09/17/19 1331 111/69 -- -- 87 -- 99 %  09/17/19 1300 118/74 -- -- 98 -- 99 %  09/17/19 1246 124/74 -- -- 95 -- 99 %  09/17/19 1230 119/77 98.9 F (37.2 C) Oral (!) 101 16 100 %   General: NAD Heart: Regular rate Lungs: Normal rate and effort Abd: Soft, NT, Gravid, S=D Extremities: no pitting Pedal edema Neuro: 2+ deep tendon reflexes, No clonus  NST:  Baseline: 145 bpm, Variability: Good {> 6 bpm), Accelerations: Reactive and Decelerations: Absent  Results for orders placed or performed during the hospital encounter of 09/17/19 (from the past 24 hour(s))  Urinalysis, Routine w reflex microscopic     Status: Abnormal   Collection Time: 09/17/19 12:33 PM  Result Value Ref Range   Color, Urine YELLOW YELLOW   APPearance HAZY (A) CLEAR   Specific Gravity, Urine 1.015 1.005 - 1.030   pH 7.0 5.0 - 8.0   Glucose, UA NEGATIVE NEGATIVE mg/dL   Hgb urine dipstick NEGATIVE NEGATIVE   Bilirubin Urine NEGATIVE NEGATIVE   Ketones, ur NEGATIVE NEGATIVE mg/dL   Protein, ur NEGATIVE NEGATIVE mg/dL   Nitrite NEGATIVE  NEGATIVE   Leukocytes,Ua NEGATIVE NEGATIVE  Protein / creatinine ratio, urine     Status: None   Collection Time: 09/17/19 12:46 PM  Result Value Ref Range   Creatinine, Urine 133.94 mg/dL   Total Protein, Urine 11 mg/dL   Protein Creatinine Ratio 0.08 0.00 - 0.15 mg/mg[Cre]  CBC     Status: Abnormal   Collection Time: 09/17/19 12:55 PM  Result Value Ref Range   WBC 12.3 (H) 4.0 - 10.5 K/uL   RBC 3.91 3.87 - 5.11 MIL/uL   Hemoglobin 12.0 12.0 - 15.0 g/dL   HCT 73.2 20.2 - 54.2 %   MCV 92.1 80.0 - 100.0 fL   MCH 30.7 26.0 - 34.0 pg   MCHC 33.3 30.0 - 36.0 g/dL   RDW 70.6 23.7 - 62.8 %   Platelets 208 150 - 400 K/uL   nRBC 0.0 0.0 - 0.2 %  Comprehensive metabolic panel     Status: Abnormal   Collection Time: 09/17/19 12:55 PM  Result Value Ref Range   Sodium 138 135 - 145 mmol/L   Potassium 3.4 (L) 3.5 - 5.1 mmol/L   Chloride 106 98 - 111 mmol/L   CO2 23 22 - 32 mmol/L   Glucose, Bld 101 (H) 70 - 99 mg/dL   BUN <5 (L) 6 - 20 mg/dL   Creatinine, Ser 3.15 0.44 - 1.00 mg/dL   Calcium  9.0 8.9 - 10.3 mg/dL   Total Protein 6.0 (L) 6.5 - 8.1 g/dL   Albumin 3.1 (L) 3.5 - 5.0 g/dL   AST 20 15 - 41 U/L   ALT 16 0 - 44 U/L   Alkaline Phosphatase 86 38 - 126 U/L   Total Bilirubin 0.6 0.3 - 1.2 mg/dL   GFR calc non Af Amer >60 >60 mL/min   GFR calc Af Amer >60 >60 mL/min   Anion gap 9 5 - 15    MDM Elevated BP on home cuff today. Pt asymptomatic & no history of hypertension.  Normotensive throughout MAU visit & normal PEC labs. Will discharge home & have f/u later this week for BP check in the office  A:  1. Elevated BP without diagnosis of hypertension   2. [redacted] weeks gestation of pregnancy      P:  Discharge home  Preeclampsia precautions BP check this week in the office - message sent to [facility]  Jorje Guild, NP 09/17/2019 2:26 PM

## 2019-09-17 NOTE — Progress Notes (Signed)
   OBSTETRICS PRENATAL VIRTUAL VISIT ENCOUNTER NOTE  Provider location: Center for [facility] [place]   I connected with Carmen Meza on 09/17/19 at 10:15 AM EDT by MyChart Video Encounter at home and verified that I am speaking with the correct person using two identifiers.   I discussed the limitations, risks, security and privacy concerns of performing an evaluation and management service virtually and the availability of in person appointments. I also discussed with the patient that there may be a patient responsible charge related to this service. The patient expressed understanding and agreed to proceed. Subjective:  Carmen Meza is a 29 y.o. G1P0 at [redacted]w[redacted]d being seen today for ongoing prenatal care.  She is currently monitored for the following issues for this low-risk pregnancy and has Supervision of normal pregnancy, antepartum; LGSIL of cervix of undetermined significance; and Obesity affecting pregnancy, antepartum on their problem list.  Patient reports no complaints.  Contractions: Not present. Vag. Bleeding: None.  Movement: Present. Denies any leaking of fluid.   The following portions of the patient's history were reviewed and updated as appropriate: allergies, current medications, past family history, past medical history, past social history, past surgical history and problem list.   Objective:   Vitals:   09/17/19 1022  BP: (!) 133/93  Pulse: 88    Fetal Status:     Movement: Present     General:  Alert, oriented and cooperative. Patient is in no acute distress.  Respiratory: Normal respiratory effort, no problems with respiration noted  Mental Status: Normal mood and affect. Normal behavior. Normal judgment and thought content.  Rest of physical exam deferred due to type of encounter  Imaging: No results found.  Assessment and Plan:  Pregnancy: G1P0 at [redacted]w[redacted]d 1. Encounter for supervision of normal pregnancy, antepartum, unspecified  gravidity  2. Obesity affecting pregnancy, antepartum   Term labor symptoms and general obstetric precautions including but not limited to vaginal bleeding, contractions, leaking of fluid and fetal movement were reviewed in detail with the patient. I discussed the assessment and treatment plan with the patient. The patient was provided an opportunity to ask questions and all were answered. The patient agreed with the plan and demonstrated an understanding of the instructions. The patient was advised to call back or seek an in-person office evaluation/go to [facility]U at [facility] for any urgent or concerning symptoms. Please refer to After Visit Summary for other counseling recommendations.   I provided 10 minutes of face-to-face time during this encounter.  Return in about 1 week (around 09/24/2019) for ROB.  NST.  Future Appointments  Date Time Provider Department Center  09/30/2019 12:00 AM [facility] SCHED ROOM [facility] None    Coral Ceo, MD Center for [facility], [facility] 09/17/2019

## 2019-09-17 NOTE — MAU Note (Signed)
Carmen Meza is a 29 y.o. at [redacted]w[redacted]d here in MAU reporting: had a virtual visit this AM and when asked to check her BP it was 133/93 and then rechecked and it was 120/96. They advised her to come in for evaluation. No headaches, visual changes, or RUQ pain. Some DFM.  Onset of complaint: today  Pain score: 0/10  Vitals:   09/17/19 1230  BP: 119/77  Pulse: (!) 101  Resp: 16  Temp: 98.9 F (37.2 C)  SpO2: 100%     Lab orders placed from triage: UA

## 2019-09-17 NOTE — Discharge Instructions (Signed)
Fetal Movement Counts Patient Name: ________________________________________________ Patient Due Date: ____________________ What is a fetal movement count?  A fetal movement count is the number of times that you feel your baby move during a certain amount of time. This may also be called a fetal kick count. A fetal movement count is recommended for every pregnant woman. You may be asked to start counting fetal movements as early as week 28 of your pregnancy. Pay attention to when your baby is most active. You may notice your baby's sleep and wake cycles. You may also notice things that make your baby move more. You should do a fetal movement count:  When your baby is normally most active.  At the same time each day. A good time to count movements is while you are resting, after having something to eat and drink. How do I count fetal movements? 1. Find a quiet, comfortable area. Sit, or lie down on your side. 2. Write down the date, the start time and stop time, and the number of movements that you felt between those two times. Take this information with you to your health care visits. 3. Write down your start time when you feel the first movement. 4. Count kicks, flutters, swishes, rolls, and jabs. You should feel at least 10 movements. 5. You may stop counting after you have felt 10 movements, or if you have been counting for 2 hours. Write down the stop time. 6. If you do not feel 10 movements in 2 hours, contact your health care provider for further instructions. Your health care provider may want to do additional tests to assess your baby's well-being. Contact a health care provider if:  You feel fewer than 10 movements in 2 hours.  Your baby is not moving like he or she usually does. Date: ____________ Start time: ____________ Stop time: ____________ Movements: ____________ Date: ____________ Start time: ____________ Stop time: ____________ Movements: ____________ Date: ____________  Start time: ____________ Stop time: ____________ Movements: ____________ Date: ____________ Start time: ____________ Stop time: ____________ Movements: ____________ Date: ____________ Start time: ____________ Stop time: ____________ Movements: ____________ Date: ____________ Start time: ____________ Stop time: ____________ Movements: ____________ Date: ____________ Start time: ____________ Stop time: ____________ Movements: ____________ Date: ____________ Start time: ____________ Stop time: ____________ Movements: ____________ Date: ____________ Start time: ____________ Stop time: ____________ Movements: ____________ This information is not intended to replace advice given to you by your health care provider. Make sure you discuss any questions you have with your health care provider. Document Revised: 01/10/2019 Document Reviewed: 01/10/2019 Elsevier Patient Education  2020 [employer]     Hypertension During Pregnancy High blood pressure (hypertension) is when the force of blood pumping through the arteries is too strong. Arteries are blood vessels that carry blood from the heart throughout the body. Hypertension during pregnancy can be mild or severe. Severe hypertension during pregnancy (preeclampsia) is a medical emergency that requires prompt evaluation and treatment. Different types of hypertension can happen during pregnancy. These include:  Chronic hypertension. This happens when you had high blood pressure before you became pregnant, and it continues during the pregnancy. Hypertension that develops before you are [redacted] weeks pregnant and continues during the pregnancy is also called chronic hypertension. If you have chronic hypertension, it will not go away after you have your baby. You will need follow-up visits with your health care provider after you have your baby. Your doctor may want you to keep taking medicine for your blood pressure.  Gestational hypertension. This is    hypertension that develops after the 20th week of pregnancy. Gestational hypertension usually goes away after you have your baby, but your health care provider will need to monitor your blood pressure to make sure that it is getting better.  Preeclampsia. This is severe hypertension during pregnancy. This can cause serious complications for you and your baby and can also cause complications for you after the delivery of your baby.  Postpartum preeclampsia. You may develop severe hypertension after giving birth. This usually occurs within 48 hours after childbirth but may occur up to 6 weeks after giving birth. This is rare. How does this affect me? Women who have hypertension during pregnancy have a greater chance of developing hypertension later in life or during future pregnancies. In some cases, hypertension during pregnancy can cause serious complications, such as:  Stroke.  Heart attack.  Injury to other organs, such as kidneys, lungs, or liver.  Preeclampsia.  Convulsions or seizures.  Placental abruption. How does this affect my baby? Hypertension during pregnancy can affect your baby. Your baby may:  Be born early (prematurely).  Not weigh as much as he or she should at birth (low birth weight).  Not tolerate labor well, leading to an unplanned cesarean delivery. What are the risks? There are certain factors that make it more likely for you to develop hypertension during pregnancy. These include:  Having hypertension during a previous pregnancy.  Being overweight.  Being age 35 or older.  Being pregnant for the first time.  Being pregnant with more than one baby.  Becoming pregnant using fertilization methods, such as IVF (in vitro fertilization).  Having other medical problems, such as diabetes, kidney disease, or lupus.  Having a family history of hypertension. What can I do to lower my risk? The exact cause of hypertension during pregnancy is not known. You  may be able to lower your risk by:  Maintaining a healthy weight.  Eating a healthy and balanced diet.  Following your health care provider's instructions about treating any long-term conditions that you had before becoming pregnant. It is very important to keep all of your prenatal care appointments. Your health care provider will check your blood pressure and make sure that your pregnancy is progressing as expected. If a problem is found, early treatment can prevent complications. How is this treated? Treatment for hypertension during pregnancy varies depending on the type of hypertension you have and how serious it is.  If you were taking medicine for high blood pressure before you became pregnant, talk with your health care provider. You may need to change medicine during pregnancy because some medicines, like ACE inhibitors, may not be considered safe for your baby.  If you have gestational hypertension, your health care provider may order medicine to treat this during pregnancy.  If you are at risk for preeclampsia, your health care provider may recommend that you take a low-dose aspirin during your pregnancy.  If you have severe hypertension, you may need to be hospitalized so you and your baby can be monitored closely. You may also need to be given medicine to lower your blood pressure. This medicine may be given by mouth or through an IV.  In some cases, if your condition gets worse, you may need to deliver your baby early. Follow these instructions at home: Eating and drinking   Drink enough fluid to keep your urine pale yellow.  Avoid caffeine. Lifestyle  Do not use any products that contain nicotine or tobacco, such as cigarettes, e-cigarettes,   and chewing tobacco. If you need help quitting, ask your health care provider.  Do not use alcohol or drugs.  Avoid stress as much as possible.  Rest and get plenty of sleep.  Regular exercise can help to reduce your blood  pressure. Ask your health care provider what kinds of exercise are best for you. General instructions  Take over-the-counter and prescription medicines only as told by your health care provider.  Keep all prenatal and follow-up visits as told by your health care provider. This is important. Contact a health care provider if:  You have symptoms that your health care provider told you may require more treatment or monitoring, such as: ? Headaches. ? Nausea or vomiting. ? Abdominal pain. ? Dizziness. ? Light-headedness. Get help right away if:  You have: ? Severe abdominal pain that does not get better with treatment. ? A severe headache that does not get better. ? Vomiting that does not get better. ? Sudden, rapid weight gain. ? Sudden swelling in your hands, ankles, or face. ? Vaginal bleeding. ? Blood in your urine. ? Blurred or double vision. ? Shortness of breath or chest pain. ? Weakness on one side of your body. ? Difficulty speaking.  Your baby is not moving as much as usual. Summary  High blood pressure (hypertension) is when the force of blood pumping through the arteries is too strong.  Hypertension during pregnancy can cause problems for you and your baby.  Treatment for hypertension during pregnancy varies depending on the type of hypertension you have and how serious it is.  Keep all prenatal and follow-up visits as told by your health care provider. This is important. This information is not intended to replace advice given to you by your health care provider. Make sure you discuss any questions you have with your health care provider. Document Revised: 09/13/2018 Document Reviewed: 06/19/2018 Elsevier Patient Education  2020 Elsevier Inc.  

## 2019-09-18 ENCOUNTER — Telehealth (HOSPITAL_COMMUNITY): Payer: Self-pay | Admitting: *Deleted

## 2019-09-18 NOTE — Telephone Encounter (Signed)
Preadmission screen  

## 2019-09-20 ENCOUNTER — Ambulatory Visit: Payer: Medicaid Other

## 2019-09-23 ENCOUNTER — Telehealth (HOSPITAL_COMMUNITY): Payer: Self-pay | Admitting: *Deleted

## 2019-09-23 ENCOUNTER — Encounter (HOSPITAL_COMMUNITY): Payer: Self-pay | Admitting: *Deleted

## 2019-09-23 NOTE — Telephone Encounter (Signed)
Preadmission screen  

## 2019-09-24 ENCOUNTER — Other Ambulatory Visit: Payer: Self-pay

## 2019-09-24 ENCOUNTER — Ambulatory Visit (INDEPENDENT_AMBULATORY_CARE_PROVIDER_SITE_OTHER): Payer: Medicaid Other | Admitting: Obstetrics

## 2019-09-24 ENCOUNTER — Encounter: Payer: Self-pay | Admitting: Obstetrics

## 2019-09-24 VITALS — BP 110/79 | HR 97 | Wt 201.8 lb

## 2019-09-24 DIAGNOSIS — O99210 Obesity complicating pregnancy, unspecified trimester: Secondary | ICD-10-CM

## 2019-09-24 DIAGNOSIS — R87612 Low grade squamous intraepithelial lesion on cytologic smear of cervix (LGSIL): Secondary | ICD-10-CM

## 2019-09-24 DIAGNOSIS — Z3400 Encounter for supervision of normal first pregnancy, unspecified trimester: Secondary | ICD-10-CM

## 2019-09-24 DIAGNOSIS — O99213 Obesity complicating pregnancy, third trimester: Secondary | ICD-10-CM | POA: Diagnosis not present

## 2019-09-24 DIAGNOSIS — E669 Obesity, unspecified: Secondary | ICD-10-CM

## 2019-09-24 DIAGNOSIS — O99891 Other specified diseases and conditions complicating pregnancy: Secondary | ICD-10-CM | POA: Diagnosis not present

## 2019-09-24 DIAGNOSIS — Z3A40 40 weeks gestation of pregnancy: Secondary | ICD-10-CM | POA: Diagnosis not present

## 2019-09-24 NOTE — Progress Notes (Signed)
Subjective:  Carmen Meza is a 29 y.o. G1P0 at [redacted]w[redacted]d being seen today for ongoing prenatal care.  She is currently monitored for the following issues for this low-risk pregnancy and has Supervision of normal pregnancy, antepartum; LGSIL of cervix of undetermined significance; and Obesity affecting pregnancy, antepartum on their problem list.  Patient reports no complaints.  Contractions: Not present. Vag. Bleeding: None.  Movement: Present. Denies leaking of fluid.   The following portions of the patient's history were reviewed and updated as appropriate: allergies, current medications, past family history, past medical history, past social history, past surgical history and problem list. Problem list updated.  Objective:   Vitals:   09/24/19 1021  BP: 110/79  Pulse: 97  Weight: 201 lb 12.8 oz (91.5 kg)    Fetal Status: Fetal Heart Rate (bpm): NST   Movement: Present     General:  Alert, oriented and cooperative. Patient is in no acute distress.  Skin: Skin is warm and dry. No rash noted.   Cardiovascular: Normal heart rate noted  Respiratory: Normal respiratory effort, no problems with respiration noted  Abdomen: Soft, gravid, appropriate for gestational age. Pain/Pressure: Present     Pelvic:  Cervical exam deferred        Extremities: Normal range of motion.  Edema: None  Mental Status: Normal mood and affect. Normal behavior. Normal judgment and thought content.   Urinalysis:      Assessment and Plan:  Pregnancy: G1P0 at [redacted]w[redacted]d  1. Supervision of normal first pregnancy, antepartum Rx: - Fetal nonstress test; Future  2. Obesity affecting pregnancy, antepartum  3. LGSIL of cervix of undetermined significance - colposcopy 3-4 months postpartum   Term labor symptoms and general obstetric precautions including but not limited to vaginal bleeding, contractions, leaking of fluid and fetal movement were reviewed in detail with the patient. Please refer to After Visit  Summary for other counseling recommendations.   Follow up postpartum    Brock Bad, MD  09/24/2019

## 2019-09-24 NOTE — Progress Notes (Signed)
Patient reports fetal movement, denies contractions and reports occasional pressure.

## 2019-09-25 ENCOUNTER — Other Ambulatory Visit: Payer: Self-pay | Admitting: Advanced Practice Midwife

## 2019-09-28 ENCOUNTER — Other Ambulatory Visit (HOSPITAL_COMMUNITY)
Admission: RE | Admit: 2019-09-28 | Discharge: 2019-09-28 | Disposition: A | Discharge status: Home / Self Care | Payer: Medicaid Other | Source: Ambulatory Visit | Attending: Family Medicine | Admitting: Family Medicine

## 2019-09-28 DIAGNOSIS — Z01812 Encounter for preprocedural laboratory examination: Secondary | ICD-10-CM | POA: Diagnosis not present

## 2019-09-28 DIAGNOSIS — Z20822 Contact with and (suspected) exposure to covid-19: Secondary | ICD-10-CM | POA: Insufficient documentation

## 2019-09-28 LAB — SARS CORONAVIRUS 2 (TAT 6-24 HRS): SARS Coronavirus 2: NEGATIVE

## 2019-09-30 ENCOUNTER — Encounter (HOSPITAL_COMMUNITY): Payer: Self-pay | Admitting: Obstetrics and Gynecology

## 2019-09-30 ENCOUNTER — Inpatient Hospital Stay (HOSPITAL_COMMUNITY)
Admission: AD | Admit: 2019-09-30 | Discharge: 2019-10-02 | DRG: 805 | Disposition: A | Discharge status: Home / Self Care | Payer: Medicaid Other | Attending: Obstetrics and Gynecology | Admitting: Obstetrics and Gynecology

## 2019-09-30 ENCOUNTER — Inpatient Hospital Stay (HOSPITAL_COMMUNITY): Payer: Medicaid Other

## 2019-09-30 ENCOUNTER — Inpatient Hospital Stay (HOSPITAL_COMMUNITY): Payer: Medicaid Other | Admitting: Anesthesiology

## 2019-09-30 ENCOUNTER — Other Ambulatory Visit: Payer: Self-pay

## 2019-09-30 DIAGNOSIS — O99210 Obesity complicating pregnancy, unspecified trimester: Secondary | ICD-10-CM | POA: Diagnosis present

## 2019-09-30 DIAGNOSIS — O480 Post-term pregnancy: Principal | ICD-10-CM | POA: Diagnosis present

## 2019-09-30 DIAGNOSIS — O99214 Obesity complicating childbirth: Secondary | ICD-10-CM | POA: Diagnosis present

## 2019-09-30 DIAGNOSIS — Z3A41 41 weeks gestation of pregnancy: Secondary | ICD-10-CM

## 2019-09-30 DIAGNOSIS — O411230 Chorioamnionitis, third trimester, not applicable or unspecified: Secondary | ICD-10-CM | POA: Diagnosis present

## 2019-09-30 DIAGNOSIS — E669 Obesity, unspecified: Secondary | ICD-10-CM | POA: Diagnosis present

## 2019-09-30 DIAGNOSIS — O411290 Chorioamnionitis, unspecified trimester, not applicable or unspecified: Secondary | ICD-10-CM

## 2019-09-30 LAB — CBC
HCT: 37.4 % (ref 36.0–46.0)
Hemoglobin: 12.5 g/dL (ref 12.0–15.0)
MCH: 30.9 pg (ref 26.0–34.0)
MCHC: 33.4 g/dL (ref 30.0–36.0)
MCV: 92.3 fL (ref 80.0–100.0)
Platelets: 237 10*3/uL (ref 150–400)
RBC: 4.05 MIL/uL (ref 3.87–5.11)
RDW: 13.6 % (ref 11.5–15.5)
WBC: 15.7 10*3/uL — ABNORMAL HIGH (ref 4.0–10.5)
nRBC: 0 % (ref 0.0–0.2)

## 2019-09-30 LAB — TYPE AND SCREEN
ABO/RH(D): O POS
Antibody Screen: NEGATIVE

## 2019-09-30 LAB — ABO/RH: ABO/RH(D): O POS

## 2019-09-30 LAB — RPR: RPR Ser Ql: NONREACTIVE

## 2019-09-30 MED ORDER — FENTANYL CITRATE (PF) 100 MCG/2ML IJ SOLN
100.0000 ug | INTRAMUSCULAR | Status: DC | PRN
  Administered 2019-09-30 (×2): 100 ug via INTRAVENOUS
  Filled 2019-09-30 (×2): qty 2

## 2019-09-30 MED ORDER — MISOPROSTOL 50MCG HALF TABLET
50.0000 ug | ORAL_TABLET | ORAL | Status: DC | PRN
  Administered 2019-09-30 (×2): 50 ug via BUCCAL
  Filled 2019-09-30 (×2): qty 1

## 2019-09-30 MED ORDER — MISOPROSTOL 25 MCG QUARTER TABLET: 25.0000 ug | ORAL_TABLET | ORAL | Status: DC

## 2019-09-30 MED ORDER — LACTATED RINGERS IV SOLN: 500.0000 mL | INTRAVENOUS | Status: DC | PRN

## 2019-09-30 MED ORDER — EPHEDRINE 5 MG/ML INJ: 10.0000 mg | INTRAVENOUS | Status: DC | PRN

## 2019-09-30 MED ORDER — ACETAMINOPHEN 325 MG PO TABS
650.0000 mg | ORAL_TABLET | ORAL | Status: DC | PRN
  Administered 2019-09-30: 650 mg via ORAL
  Filled 2019-09-30: qty 2

## 2019-09-30 MED ORDER — OXYTOCIN 40 UNITS IN NORMAL SALINE INFUSION - SIMPLE MED
2.5000 [IU]/h | INTRAVENOUS | Status: DC
  Administered 2019-09-30: 2.5 [IU]/h via INTRAVENOUS
  Filled 2019-09-30: qty 1000

## 2019-09-30 MED ORDER — METHYLERGONOVINE MALEATE 0.2 MG/ML IJ SOLN
0.2000 mg | INTRAMUSCULAR | Status: AC
  Administered 2019-10-01: 0.2 mg via INTRAMUSCULAR

## 2019-09-30 MED ORDER — METHYLERGONOVINE MALEATE 0.2 MG/ML IJ SOLN
INTRAMUSCULAR | Status: AC
  Filled 2019-09-30: qty 1

## 2019-09-30 MED ORDER — ONDANSETRON HCL 4 MG/2ML IJ SOLN
4.0000 mg | Freq: Four times a day (QID) | INTRAMUSCULAR | Status: DC | PRN
  Administered 2019-09-30: 4 mg via INTRAVENOUS
  Filled 2019-09-30: qty 2

## 2019-09-30 MED ORDER — LIDOCAINE HCL (PF) 1 % IJ SOLN
INTRAMUSCULAR | Status: DC | PRN
  Administered 2019-09-30: 5 mL via EPIDURAL

## 2019-09-30 MED ORDER — OXYTOCIN BOLUS FROM INFUSION
500.0000 mL | Freq: Once | INTRAVENOUS | Status: AC
  Administered 2019-09-30: 500 mL via INTRAVENOUS

## 2019-09-30 MED ORDER — TERBUTALINE SULFATE 1 MG/ML IJ SOLN: 0.2500 mg | Freq: Once | INTRAMUSCULAR | Status: DC | PRN

## 2019-09-30 MED ORDER — SODIUM CHLORIDE (PF) 0.9 % IJ SOLN
INTRAMUSCULAR | Status: DC | PRN
  Administered 2019-09-30: 12 mL/h via EPIDURAL

## 2019-09-30 MED ORDER — FENTANYL-BUPIVACAINE-NACL 0.5-0.125-0.9 MG/250ML-% EP SOLN
12.0000 mL/h | EPIDURAL | Status: DC | PRN
  Filled 2019-09-30: qty 250

## 2019-09-30 MED ORDER — SOD CITRATE-CITRIC ACID 500-334 MG/5ML PO SOLN: 30.0000 mL | ORAL | Status: DC | PRN

## 2019-09-30 MED ORDER — MISOPROSTOL 25 MCG QUARTER TABLET
ORAL_TABLET | ORAL | Status: AC
  Filled 2019-09-30: qty 1

## 2019-09-30 MED ORDER — LACTATED RINGERS IV SOLN: 500.0000 mL | Freq: Once | INTRAVENOUS | Status: DC

## 2019-09-30 MED ORDER — PHENYLEPHRINE 40 MCG/ML (10ML) SYRINGE FOR IV PUSH (FOR BLOOD PRESSURE SUPPORT): 80.0000 ug | PREFILLED_SYRINGE | INTRAVENOUS | Status: DC | PRN

## 2019-09-30 MED ORDER — CLINDAMYCIN PHOSPHATE 900 MG/50ML IV SOLN
900.0000 mg | Freq: Three times a day (TID) | INTRAVENOUS | Status: DC
  Administered 2019-09-30: 900 mg via INTRAVENOUS
  Filled 2019-09-30: qty 50

## 2019-09-30 MED ORDER — PHENYLEPHRINE 40 MCG/ML (10ML) SYRINGE FOR IV PUSH (FOR BLOOD PRESSURE SUPPORT)
80.0000 ug | PREFILLED_SYRINGE | INTRAVENOUS | Status: DC | PRN
  Filled 2019-09-30: qty 10

## 2019-09-30 MED ORDER — LIDOCAINE HCL (PF) 1 % IJ SOLN: 30.0000 mL | INTRAMUSCULAR | Status: DC | PRN

## 2019-09-30 MED ORDER — LACTATED RINGERS IV SOLN: INTRAVENOUS | Status: DC

## 2019-09-30 MED ORDER — GENTAMICIN SULFATE 40 MG/ML IJ SOLN
5.0000 mg/kg | INTRAVENOUS | Status: DC
  Administered 2019-09-30: 350 mg via INTRAVENOUS
  Filled 2019-09-30 (×2): qty 8.75

## 2019-09-30 MED ORDER — DIPHENHYDRAMINE HCL 50 MG/ML IJ SOLN: 12.5000 mg | INTRAMUSCULAR | Status: DC | PRN

## 2019-09-30 MED ORDER — OXYTOCIN 40 UNITS IN NORMAL SALINE INFUSION - SIMPLE MED
1.0000 m[IU]/min | INTRAVENOUS | Status: DC
  Administered 2019-09-30: 2 m[IU]/min via INTRAVENOUS

## 2019-09-30 NOTE — Progress Notes (Signed)
Pharmacy Antibiotic Note  Carmen Meza is a 29 y.o. female admitted on 09/30/2019 for IOL due to postdate.  Pharmacy has been consulted for Gentamicin dosing for chorioamnionitis (triple I) due to maternal temp during labor.  Plan: Gentamicin 350mg  (5mg /kg) IV q24h Will continue to follow  Height: 5\' 4"  (162.6 cm) Weight: 91.7 kg (202 lb 3.2 oz) IBW/kg (Calculated) : 54.7  Dosing/ Adjusted weight: 69.5kg  Temp (24hrs), Avg:99.3 F (37.4 C), Min:98.7 F (37.1 C), Max:100.4 F (38 C)  Recent Labs  Lab 09/30/19 0030  WBC 15.7*    Estimated Creatinine Clearance: 115.9 mL/min (by C-G formula based on SCr of 0.7 mg/dL).    Allergies  Allergen Reactions  . Penicillins     Antimicrobials this admission:   Thank you for allowing pharmacy to be a part of this patient's care.  09/30/2019 9:14 PM

## 2019-09-30 NOTE — Plan of Care (Signed)
  Problem: Education: Goal: Knowledge of Childbirth will improve Outcome: Progressing Goal: Ability to make informed decisions regarding treatment and plan of care will improve Outcome: Progressing Goal: Ability to state and carry out methods to decrease the pain will improve Outcome: Progressing   

## 2019-09-30 NOTE — Progress Notes (Signed)
LABOR PROGRESS NOTE  Carmen Meza is a 29 y.o. G1P0 at [redacted]w[redacted]d  admitted for PD IOL  Subjective: Patient received epidural around 1330 and is now comfortable with epidural, reports feeling tightening but denies pain at this time.   Objective: BP 103/61   Pulse 91   Temp 98.7 F (37.1 C) (Oral)   Resp 16   Ht 5\' 4"  (1.626 m)   Wt 91.7 kg   LMP 12/17/2018 (Exact Date)   SpO2 100%   BMI 34.71 kg/m  or  Vitals:   09/30/19 1420 09/30/19 1421 09/30/19 1431 09/30/19 1501  BP:  119/75 123/77 103/61  Pulse:  91 87 91  Resp:  16 16 16   Temp:      TempSrc:      SpO2: 100%     Weight:      Height:        AROM @ 1445, clear fluid  Dilation: 4 Effacement (%): 90 Cervical Position: Posterior Station: -2 Presentation: Vertex Exam by:: CNM FHT: baseline rate 150, moderate/minimal varibility, +accel, early decel Toco: 2-4  Labs: Lab Results  Component Value Date   WBC 15.7 (H) 09/30/2019   HGB 12.5 09/30/2019   HCT 37.4 09/30/2019   MCV 92.3 09/30/2019   PLT 237 09/30/2019    Patient Active Problem List   Diagnosis Date Noted  . Labor and delivery, indication for care 09/30/2019  . Obesity affecting pregnancy, antepartum 06/20/2019  . LGSIL of cervix of undetermined significance 05/15/2019  . Supervision of normal pregnancy, antepartum 05/14/2019    Assessment / Plan: 29 y.o. G1P0 at [redacted]w[redacted]d here for PD IOL   Labor: AROM @1445 , IUPC placed with AROM, continue to titrate pitocin to adequate MVUs, currently on 2 milli/min of pitocin.  Fetal Wellbeing:  Cat I  Pain Control:  Epidural  Anticipated MOD:  SVD  34, CNM 09/30/2019, 3:04 PM

## 2019-09-30 NOTE — Progress Notes (Signed)
Labor Progress Note Carmen Meza is a 29 y.o. G1P0 at [redacted]w[redacted]d presented for IOL for PD. S: Comfortable with epidural.  O:  BP 105/69   Pulse 98   Temp (!) 100.4 F (38 C) (Oral)   Resp 16   Ht 5\' 4"  (1.626 m)   Wt 91.7 kg   LMP 12/17/2018 (Exact Date)   SpO2 100%   BMI 34.71 kg/m  EFM: 150, moderate variability, pos accels, no decels, reactive TOCO: q2-35m  CVE: Dilation: 5 Effacement (%): 100 Cervical Position: Posterior Station: -2 Presentation: Vertex Exam by:: S Nix RN   A&P: 29 y.o. G1P0 [redacted]w[redacted]d here for PD IOL. #Labor: Progressing well. S/p FB, Cytotec and AROM. Cont Pit, MVU's adequate. Anticipate SVD. #Triple I: Febrile and mild fetal tachycardia compared to prior. Gent/Clinda due to unknown PCN allergy severity.  #Pain: per patient request #FWB: Cat I #GBS negative  [redacted]w[redacted]d, MD 9:46 PM

## 2019-09-30 NOTE — Anesthesia Preprocedure Evaluation (Signed)
Anesthesia Evaluation  Patient identified by MRN, date of birth, ID band Patient awake    Reviewed: Allergy & Precautions, NPO status , Patient's Chart, lab work & pertinent test results  Airway Mallampati: II  TM Distance: >3 FB Neck ROM: Full    Dental no notable dental hx. (+) Teeth Intact   Pulmonary asthma ,    Pulmonary exam normal breath sounds clear to auscultation       Cardiovascular Exercise Tolerance: Good Normal cardiovascular exam Rhythm:Regular Rate:Normal     Neuro/Psych negative neurological ROS     GI/Hepatic negative GI ROS, Neg liver ROS,   Endo/Other  negative endocrine ROS  Renal/GU negative Renal ROS     Musculoskeletal negative musculoskeletal ROS (+)   Abdominal   Peds  Hematology   Anesthesia Other Findings   Reproductive/Obstetrics (+) Pregnancy                             Anesthesia Physical Anesthesia Plan  ASA: II  Anesthesia Plan: Epidural   Post-op Pain Management:    Induction:   PONV Risk Score and Plan:   Airway Management Planned:   Additional Equipment:   Intra-op Plan:   Post-operative Plan:   Informed Consent: I have reviewed the patients History and Physical, chart, labs and discussed the procedure including the risks, benefits and alternatives for the proposed anesthesia with the patient or authorized representative who has indicated his/her understanding and acceptance.       Plan Discussed with:   Anesthesia Plan Comments: (41 Wks G1P0 for LEA)       Anesthesia Quick Evaluation

## 2019-09-30 NOTE — Progress Notes (Signed)
LABOR PROGRESS NOTE  Carmen Meza is a 29 y.o. G1P0 at [redacted]w[redacted]d  admitted for PD IOL   Subjective: Patient doing well, reports occasional cramping with she is coping well now that FB is out, rates pain 1/10. Declines need for medication at this time. Discussed with patient plan for labor pain, does not want epidural unless it is needed later in labor   Objective: BP (!) 102/59   Pulse 89   Temp 98.7 F (37.1 C) (Oral)   Resp 18   Ht 5\' 4"  (1.626 m)   Wt 91.7 kg   LMP 12/17/2018 (Exact Date)   BMI 34.71 kg/m  or  Vitals:   09/30/19 0009 09/30/19 0335 09/30/19 0510 09/30/19 0653  BP: 133/72 111/72 119/73 (!) 102/59  Pulse: (!) 102 (!) 110 90 89  Resp: 18     Temp: 98.7 F (37.1 C)     TempSrc: Oral     Weight:      Height:        Bloody show noted with cervical examination, favorable for pitocin augmentation  Dilation: 3 Effacement (%): 60, 70 Cervical Position: Posterior Station: -2 Presentation: Vertex Exam by:: [email or web address] CNM FHT: baseline rate 130, moderate varibility, +accel, variable decel Toco: occasional mild contractions   Labs: Lab Results  Component Value Date   WBC 15.7 (H) 09/30/2019   HGB 12.5 09/30/2019   HCT 37.4 09/30/2019   MCV 92.3 09/30/2019   PLT 237 09/30/2019    Patient Active Problem List   Diagnosis Date Noted  . Labor and delivery, indication for care 09/30/2019  . Obesity affecting pregnancy, antepartum 06/20/2019  . LGSIL of cervix of undetermined significance 05/15/2019  . Supervision of normal pregnancy, antepartum 05/14/2019    Assessment / Plan: 29 y.o. G1P0 at [redacted]w[redacted]d here for PD IOL   Labor: Pitocin augmentation ordered, patient given opportunity to eat prior to initiation of pitocin, will reassess cervical examination 4 hours after initiation or as needed based on fetal tracing and patient's report  Fetal Wellbeing:  Cat II  Pain Control:  Pain medication ordered PRN  Anticipated MOD:  SVD  [redacted]w[redacted]d,  CNM 09/30/2019, 9:19 AM

## 2019-09-30 NOTE — Anesthesia Procedure Notes (Signed)
Epidural Patient location during procedure: OB Start time: 09/30/2019 1:31 PM End time: 09/30/2019 1:52 PM  Staffing Anesthesiologist: Trevor Iha, MD Performed: anesthesiologist   Preanesthetic Checklist Completed: patient identified, IV checked, site marked, risks and benefits discussed, surgical consent, monitors and equipment checked, pre-op evaluation and timeout performed  Epidural Patient position: sitting Prep: DuraPrep and site prepped and draped Patient monitoring: continuous pulse ox and blood pressure Approach: midline Location: L3-L4 Injection technique: LOR air  Needle:  Needle type: Tuohy  Needle gauge: 17 G Needle length: 9 cm and 9 Needle insertion depth: 9 cm Catheter type: closed end flexible Catheter size: 19 Gauge Catheter at skin depth: 15 cm Test dose: negative  Assessment Events: blood not aspirated, injection not painful, no injection resistance, no paresthesia and negative IV test  Additional Notes Patient identified. Risks/Benefits/Options discussed with patient including but not limited to bleeding, infection, nerve damage, paralysis, failed block, incomplete pain control, headache, blood pressure changes, nausea, vomiting, reactions to medication both or allergic, itching and postpartum back pain. Confirmed with bedside nurse the patient's most recent platelet count. Confirmed with patient that they are not currently taking any anticoagulation, have any bleeding history or any family history of bleeding disorders. Patient expressed understanding and wished to proceed. All questions were answered. Sterile technique was used throughout the entire procedure. Please see nursing notes for vital signs. Test dose was given through epidural needle and negative prior to continuing to dose epidural or start infusion. Warning signs of high block given to the patient including shortness of breath, tingling/numbness in hands, complete motor block, or any  concerning symptoms with instructions to call for help. Patient was given instructions on fall risk and not to get out of bed. All questions and concerns addressed with instructions to call with any issues.  3 Attempt (S) . Patient tolerated procedure well.

## 2019-09-30 NOTE — Progress Notes (Signed)
Labor Progress Note ALEAYAH CHICO is a 29 y.o. G1P0 at [redacted]w[redacted]d presented for IOL for PD. S: More comfortable s/p FB.  O:  BP 119/73   Pulse 90   Temp 98.7 F (37.1 C) (Oral)   Resp 18   Ht 5\' 4"  (1.626 m)   Wt 91.7 kg   LMP 12/17/2018 (Exact Date)   BMI 34.71 kg/m  EFM: 145, moderate variability, pos accels, no decels, reactive TOCO: q2-58m  CVE: Dilation: 2 Effacement (%): 50 Cervical Position: Posterior Station: -3 Presentation: Vertex Exam by:: [email or web address], RN   A&P: 29 y.o. G1P0 [redacted]w[redacted]d here for PD IOL. #Labor: Progressing well. S/p FB. Cont Cytotec induction. AROM/Pit PRN. Anticipate SVD. #Pain: per patient request #FWB: Cat I #GBS negative  [redacted]w[redacted]d, MD 5:24 AM

## 2019-09-30 NOTE — Discharge Summary (Signed)
Postpartum Discharge Summary     Patient Name: Carmen Meza DOB: Jun 07, 1990 MRN: [id number]  Date of admission: 09/30/2019 Delivering Provider: Chauncey Mann   Date of discharge: 10/02/2019  Admitting diagnosis: Labor and delivery, indication for care [O75.9] Intrauterine pregnancy: [redacted]w[redacted]d    Secondary diagnosis:  Active Problems:   Obesity affecting pregnancy, antepartum   Labor and delivery, indication for care   Chorioamnionitis  Additional problems: None     Discharge diagnosis: Term Pregnancy Delivered                                                                                                Post partum procedures:None  Augmentation: AROM, Pitocin, Cytotec and Foley Balloon  Complications: Intrauterine Inflammation or infection (Chorioamniotis)  Hospital course:  Induction of Labor With Vaginal Delivery   29y.o. yo G1P0 at 477w0das admitted to the hospital 09/30/2019 for induction of labor.  Indication for induction: Postdates.  Patient had an uncomplicated labor course as follows: On admission, patient was 0.5/thick/-3. She was given FB, cytotec and pitocin. She was then AROM'd on 4/26 @ 1425 and IUPC inserted. She was given epidural for pain relief. During labor, she developed intrapartum Triple I and received abx. She then progressed to complete.  Membrane Rupture Time/Date: 2:45 PM ,09/30/2019   Intrapartum Procedures: Episiotomy: None [1]                                         Lacerations:  Periurethral [8];Labial [10]  Patient had delivery of a Viable infant.  Information for the patient's newborn:  Carmen Meza, Andersson0[[id number]]Delivery Method: Vag-Spont    09/30/2019  Details of delivery can be found in separate delivery note. Afebrile post-partum. Declines contraception.  Patient had a routine postpartum course. Patient is discharged home 10/02/19. Delivery time: 10:56 PM    Magnesium Sulfate received: No BMZ received:  No Rhophylac:No MMR:No Transfusion:No  Physical exam  Vitals:   10/01/19 0649 10/01/19 0949 10/01/19 1334 10/01/19 2255  BP: 110/71 110/68 110/71 117/72  Pulse: 88 91 82 90  Resp: '16 17 18   ' Temp: 98.5 F (36.9 C) 98.4 F (36.9 C) 97.8 F (36.6 C) 98.6 F (37 C)  TempSrc: Oral  Oral Oral  SpO2: 99% 99% 96% (!) 87%  Weight:      Height:       General: alert, cooperative and no distress Lochia: appropriate Uterine Fundus: firm Incision: N/A DVT Evaluation: No evidence of DVT seen on physical exam. Labs: Lab Results  Component Value Date   WBC 22.5 (H) 10/01/2019   HGB 11.8 (L) 10/01/2019   HCT 35.1 (L) 10/01/2019   MCV 92.6 10/01/2019   PLT 184 10/01/2019   CMP Latest Ref Rng & Units 09/17/2019  Glucose 70 - 99 mg/dL 101(H)  BUN 6 - 20 mg/dL <5(L)  Creatinine 0.44 - 1.00 mg/dL 0.70  Sodium 135 - 145 mmol/L 138  Potassium 3.5 - 5.1 mmol/L 3.4(L)  Chloride 98 -  111 mmol/L 106  CO2 22 - 32 mmol/L 23  Calcium 8.9 - 10.3 mg/dL 9.0  Total Protein 6.5 - 8.1 g/dL 6.0(L)  Total Bilirubin 0.3 - 1.2 mg/dL 0.6  Alkaline Phos 38 - 126 U/L 86  AST 15 - 41 U/L 20  ALT 0 - 44 U/L 16   Edinburgh Score: No flowsheet data found.  Discharge instruction: per After Visit Summary and "Baby and Me Booklet".  After visit meds:  Allergies as of 10/02/2019      Reactions   Penicillins       Medication List    STOP taking these medications   CHOLINE PO   IRON PO     TAKE these medications   acetaminophen 325 MG tablet Commonly known as: Tylenol Take 2 tablets (650 mg total) by mouth every 6 (six) hours as needed (for pain scale < 4).   Blood Pressure Kit Devi 1 kit by Does not apply route once a week. Check BP regularly and record readings into the Babyscripts App.  Large Cuff.  DX O90.0   ibuprofen 600 MG tablet Commonly known as: ADVIL Take 1 tablet (600 mg total) by mouth every 6 (six) hours.   PRENATAL PO Take by mouth.   senna-docusate 8.6-50 MG  tablet Commonly known as: Senokot-S Take 2 tablets by mouth daily. Start taking on: October 03, 2019       Diet: routine diet  Activity: Advance as tolerated. Pelvic rest for 6 weeks.   Outpatient follow up:4 weeks Follow up Appt: Future Appointments  Date Time Provider Vander  10/29/2019  1:00 PM Leftwich-Kirby, Kathie Dike, CNM [facility] None   Follow up Visit:    Please schedule this patient for Postpartum visit in: 4 weeks with the following provider: Any provider Virtual For C/S patients schedule nurse incision check in weeks 2 weeks: no Low risk pregnancy complicated by: none Delivery mode:  SVD Anticipated Birth Control:  none PP Procedures needed: none  Schedule Integrated BH visit: no     Newborn Data: Live born female  Birth Weight: 3396g   APGAR: 7, 9  Newborn Delivery   Birth date/time: 09/30/2019 22:56:00 Delivery type: Vaginal, Spontaneous      Baby Feeding: Breast Disposition:home with mother   10/02/2019 Chauncey Mann, MD

## 2019-09-30 NOTE — H&P (Signed)
OBSTETRIC ADMISSION HISTORY AND PHYSICAL  Carmen Meza is a 29 y.o. female G1P0 with IUP at 28w0dby LMP presenting for IOL for PD. She reports +FMs, No LOF, no VB, no blurry vision, headaches or peripheral edema, and RUQ pain.  She plans on breast feeding. She declines birth control. [facility] --->  Estimated Date of Delivery: 09/23/19  Sono: 06/06/2019   _0 , CWD, normal anatomy, cephalic presentation, posterior placental lie, 714g, 48% EFW  Prenatal History/Complications: Maternal Obesity Post-dates  Past Medical History: Past Medical History:  Diagnosis Date  . Asthma    As a child    Past Surgical History: Past Surgical History:  Procedure Laterality Date  . NO PAST SURGERIES      Obstetrical History: OB History    Gravida  1   Para      Term      Preterm      AB      Living        SAB      TAB      Ectopic      Multiple      Live Births              Social History: Social History   Socioeconomic History  . Marital status: Single    Spouse name: Not on file  . Number of children: Not on file  . Years of education: Not on file  . Highest education level: Not on file  Occupational History  . Not on file  Tobacco Use  . Smoking status: Never Smoker  . Smokeless tobacco: Never Used  Substance and Sexual Activity  . Alcohol use: Never  . Drug use: Never  . Sexual activity: Yes    Birth control/protection: None  Other Topics Concern  . Not on file  Social History Narrative  . Not on file   Social Determinants of Health   Financial Resource Strain:   . Difficulty of Paying Living Expenses:   Food Insecurity:   . Worried About RCharity fundraiserin the Last Year:   . RArboriculturistin the Last Year:   Transportation Needs:   . LFilm/video editor(Medical):   .Marland KitchenLack of Transportation (Non-Medical):   Physical Activity:   . Days of Exercise per Week:   . Minutes of  Exercise per Session:   Stress:   . Feeling of Stress :   Social Connections:   . Frequency of Communication with Friends and Family:   . Frequency of Social Gatherings with Friends and Family:   . Attends Religious Services:   . Active Member of Clubs or Organizations:   . Attends CArchivistMeetings:   .Marland KitchenMarital Status:     Family History: Family History  Problem Relation Age of Onset  . Seizures Mother   . Seizures Maternal Uncle   . Seizures Maternal Grandfather     Allergies: Allergies  Allergen Reactions  . Penicillins     Medications Prior to Admission  Medication Sig Dispense Refill Last Dose  . Blood Pressure Monitoring (BLOOD PRESSURE KIT) DEVI 1 kit by Does not apply route once a week. Check BP regularly and record readings into the Babyscripts App.  Large Cuff.  DX O90.0 1 each 0   . CHOLINE PO Take by mouth.     . Ferrous Sulfate (IRON PO) Take by mouth.     .Marland Kitchen  Prenatal Vit-Fe Fumarate-FA (PRENATAL PO) Take by mouth.        Review of Systems   All systems reviewed and negative except as stated in HPI  Blood pressure 133/72, pulse (!) 102, temperature 98.7 F (37.1 C), temperature source Oral, resp. rate 18, height _0  (1.626 m), weight 91.7 kg, last menstrual period 12/17/2018. General appearance: alert, cooperative, appears stated age and no distress Lungs: normal effort Heart: regular rate  Abdomen: soft, non-tender; bowel sounds normal Pelvic: gravid uterus GU: No vaginal lesions  Extremities: Homans sign is negative, no sign of DVT Presentation: cephalic Fetal monitoringBaseline: 145 bpm, Variability: Good {> 6 bpm), Accelerations: Reactive and Decelerations: Absent Uterine activity: None Dilation: Fingertip Effacement (%): Thick Station: -3 Exam by:: Dr. Marice Potter   Prenatal labs: ABO, Rh:   Antibody: Negative (12/09 1315) Rubella:   RPR: Non Reactive (01/27 1157)  HBsAg:    HIV: Non Reactive (01/27 1157)  GBS: Negative/-- (03/24  1037)  2 hr Glucola WNL Genetic screening  Low risk Anatomy US WNL  Prenatal Transfer Tool  Maternal Diabetes: No Genetic Screening: Normal Maternal Ultrasounds/Referrals: Normal Fetal Ultrasounds or other Referrals:  None Maternal Substance Abuse:  No Significant Maternal Medications:  None Significant Maternal Lab Results: Group B Strep negative  Results for orders placed or performed during the hospital encounter of 09/30/19 (from the past 24 hour(s))  CBC   Collection Time: 09/30/19 12:30 AM  Result Value Ref Range   WBC 15.7 (H) 4.0 - 10.5 K/uL   RBC 4.05 3.87 - 5.11 MIL/uL   Hemoglobin 12.5 12.0 - 15.0 g/dL   HCT 37.4 36.0 - 46.0 %   MCV 92.3 80.0 - 100.0 fL   MCH 30.9 26.0 - 34.0 pg   MCHC 33.4 30.0 - 36.0 g/dL   RDW 13.6 11.5 - 15.5 %   Platelets 237 150 - 400 K/uL   nRBC 0.0 0.0 - 0.2 %    Patient Active Problem List   Diagnosis Date Noted  . Labor and delivery, indication for care 09/30/2019  . Obesity affecting pregnancy, antepartum 06/20/2019  . LGSIL of cervix of undetermined significance 05/15/2019  . Supervision of normal pregnancy, antepartum 05/14/2019    Assessment/Plan:  Carmen Meza is a 29 y.o. G1P0 at 68w0dhere for IOL for Post-Dates.  #Labor: Vertex by exam. Will give Cytotec. Foley bulb placed with speculum and filled with 60 mL water; patient tolerated well. AROM/Pit PRN. Anticipate SVD. #Pain: Per patient request #FWB: Cat I; EFW: 3400g #ID:  GBS neg #MOF: Breast #MOC: Declines #Circ:  Na; female  CBarrington Ellison MD [facility] LLCFamily Medicine Fellow, FAllen [facility] Hospitalfor [facility] 09/30/2019, 1:04 AM

## 2019-10-01 ENCOUNTER — Encounter (HOSPITAL_COMMUNITY): Payer: Self-pay | Admitting: Obstetrics and Gynecology

## 2019-10-01 LAB — CBC
HCT: 35.1 % — ABNORMAL LOW (ref 36.0–46.0)
Hemoglobin: 11.8 g/dL — ABNORMAL LOW (ref 12.0–15.0)
MCH: 31.1 pg (ref 26.0–34.0)
MCHC: 33.6 g/dL (ref 30.0–36.0)
MCV: 92.6 fL (ref 80.0–100.0)
Platelets: 184 10*3/uL (ref 150–400)
RBC: 3.79 MIL/uL — ABNORMAL LOW (ref 3.87–5.11)
RDW: 13.8 % (ref 11.5–15.5)
WBC: 22.5 10*3/uL — ABNORMAL HIGH (ref 4.0–10.5)
nRBC: 0 % (ref 0.0–0.2)

## 2019-10-01 MED ORDER — COCONUT OIL OIL: 1.0000 "application " | TOPICAL_OIL | Status: DC | PRN

## 2019-10-01 MED ORDER — DIBUCAINE (PERIANAL) 1 % EX OINT: 1.0000 "application " | TOPICAL_OINTMENT | CUTANEOUS | Status: DC | PRN

## 2019-10-01 MED ORDER — TETANUS-DIPHTH-ACELL PERTUSSIS 5-2.5-18.5 LF-MCG/0.5 IM SUSP: 0.5000 mL | Freq: Once | INTRAMUSCULAR | Status: DC

## 2019-10-01 MED ORDER — ONDANSETRON HCL 4 MG PO TABS: 4.0000 mg | ORAL_TABLET | ORAL | Status: DC | PRN

## 2019-10-01 MED ORDER — ZOLPIDEM TARTRATE 5 MG PO TABS: 5.0000 mg | ORAL_TABLET | Freq: Every evening | ORAL | Status: DC | PRN

## 2019-10-01 MED ORDER — SIMETHICONE 80 MG PO CHEW: 80.0000 mg | CHEWABLE_TABLET | ORAL | Status: DC | PRN

## 2019-10-01 MED ORDER — DIPHENHYDRAMINE HCL 25 MG PO CAPS: 25.0000 mg | ORAL_CAPSULE | Freq: Four times a day (QID) | ORAL | Status: DC | PRN

## 2019-10-01 MED ORDER — ONDANSETRON HCL 4 MG/2ML IJ SOLN: 4.0000 mg | INTRAMUSCULAR | Status: DC | PRN

## 2019-10-01 MED ORDER — PRENATAL MULTIVITAMIN CH
1.0000 | ORAL_TABLET | Freq: Every day | ORAL | Status: DC
  Administered 2019-10-01: 1 via ORAL
  Filled 2019-10-01: qty 1

## 2019-10-01 MED ORDER — IBUPROFEN 600 MG PO TABS
600.0000 mg | ORAL_TABLET | Freq: Four times a day (QID) | ORAL | Status: DC
  Administered 2019-10-01 – 2019-10-02 (×6): 600 mg via ORAL
  Filled 2019-10-01 (×6): qty 1

## 2019-10-01 MED ORDER — ACETAMINOPHEN 325 MG PO TABS
650.0000 mg | ORAL_TABLET | ORAL | Status: DC | PRN
  Administered 2019-10-01: 650 mg via ORAL
  Filled 2019-10-01: qty 2

## 2019-10-01 MED ORDER — SENNOSIDES-DOCUSATE SODIUM 8.6-50 MG PO TABS
2.0000 | ORAL_TABLET | ORAL | Status: DC
  Administered 2019-10-01 – 2019-10-02 (×2): 2 via ORAL
  Filled 2019-10-01 (×2): qty 2

## 2019-10-01 MED ORDER — WITCH HAZEL-GLYCERIN EX PADS: 1.0000 "application " | MEDICATED_PAD | CUTANEOUS | Status: DC | PRN

## 2019-10-01 MED ORDER — BENZOCAINE-MENTHOL 20-0.5 % EX AERO
1.0000 "application " | INHALATION_SPRAY | CUTANEOUS | Status: DC | PRN
  Administered 2019-10-01: 1 via TOPICAL
  Filled 2019-10-01: qty 56

## 2019-10-01 NOTE — Lactation Note (Signed)
This note was copied from a baby's chart. Lactation Consultation Note  Patient Name: Carmen Meza QQPYP'P Date: 10/01/2019  P1, 5 hour term female infant. LC entered room mom and infant asleep.   Maternal Data    Feeding    LATCH Score                   Interventions    Lactation Tools Discussed/Used     Consult Status      [name] 10/01/2019, 4:15 AM

## 2019-10-01 NOTE — Lactation Note (Signed)
This note was copied from a baby's chart. Lactation Consultation Note  Patient Name: Girl Diego Delancey Today's Date: 10/01/2019   Upon entering room, MOB was finishing a pumping session. [facility] student asked how it felt and MOB stated everything was comfortable and she even got some colostrum. MOB took the EBM and finger fed it to baby. LC student updated MOB about [facility] referral being sent in and explained that [facility] prioritizes pumps for breastfeeding families. MOB wanted to make sure she hand expressed properly so [facility] student assisted with some hand expression where MOB expressed some drops to give to baby. [facility] student also wrote down the resource of Erskine Squibb Morton's hand expression video and the [employer] for MOB to reference. MOB felt confident in pump use and hand expression and had no further questions. [facility] student wrote down pump intervals at q3 hours.   Thersa Salt Caton Popowski 10/01/2019, 2:09 PM

## 2019-10-01 NOTE — Lactation Note (Signed)
This note was copied from a baby's chart. Lactation Consultation Note  Patient Name: Carmen Meza DCMME'E Date: 10/01/2019 Reason for consult: Follow-up assessment  Infant is 71 hrs old. Mom has decided to give formula while here, but may also pump when she gives formula in the hopes of increasing her potential milk supply so that she can quit giving formula once her milk comes to volume.   Initially I tried the Similac slow flow nipple, but the swallows were too fast. Infant did better with the Enfamil Extra-slow flow nipple, but still required some pacing. Mom was shown how to do this.   RN was given an update. Mom was encouraged to pump when she gives formula.  Lurline Hare [facility] 10/01/2019, 3:36 PM

## 2019-10-01 NOTE — Lactation Note (Signed)
This note was copied from a baby's chart. Lactation Consultation Note  Patient Name: Carmen Meza Date: 10/01/2019   MOB had baby on right breast in football hold when [facility] student entered the room. [facility] student went back in the room to set up pump for the dyad. LC student noticed baby was asleep at breast. LC student noticed MOB had flatter nipples and MOB mentioned the pain with latching again. [facility] student went over how to get a deep latch at the breast. LC student began explaining the pump and how a [facility] referral is being sent in for her. MOB wanted to know if she could hand express and pump and LC student assured her that she can do both it depends on what is most comfortable to her. [facility] student also explained the more baby feeds then the more baby will stool which is what we are looking for when we stress expressing milk and feeding it back to baby. MOB and FOB both had no further questions but knew they could reach out if they needed assistance. [facility] student also gave the family a pamphlet and explained the resources available to them for when they get home.   Carmen Meza 10/01/2019, 1:22 PM

## 2019-10-01 NOTE — Progress Notes (Addendum)
POSTPARTUM PROGRESS NOTE  Subjective: Carmen Meza is a 29 y.o. G1P1001 s/p SVD at [redacted]w[redacted]d.  She reports she doing well. No acute events overnight. She denies any problems with ambulating, voiding or po intake. Denies nausea or vomiting. She has passed flatus. Pain is well controlled.  Lochia is normal.  Objective: Blood pressure 110/75, pulse 99, temperature 99 F (37.2 C), temperature source Oral, resp. rate 16, height 5\' 4"  (1.626 m), weight 91.7 kg, last menstrual period 12/17/2018, SpO2 99 %, unknown if currently breastfeeding.  Physical Exam:  General: alert, cooperative and no distress Chest: no respiratory distress Abdomen: soft, non-tender  Uterine Fundus: firm, appropriately tender Extremities: No calf swelling or tenderness  No edema  Recent Labs    09/30/19 0030 10/01/19 0440  HGB 12.5 11.8*  HCT 37.4 35.1*    Assessment/Plan: Carmen Meza is a 29 y.o. G1P1001 s/p SVD at [redacted]w[redacted]d for IOL for post dates.  Routine Postpartum Care: Doing well, pain well-controlled.  -- Continue routine care, lactation support  -- Contraception: Family planning -- Feeding: Breast  Dispo: Plan for discharge 4/28 AM.  5/28, DO, PGY1  I saw and evaluated the patient. I agree with the findings and the plan of care as documented in the resident's note. Plan for discharge tomorrow.   Dorothe Pea, MD [facility] Family Medicine Fellow, [facility] for [facility]

## 2019-10-01 NOTE — Anesthesia Postprocedure Evaluation (Signed)
Anesthesia Post Note  Patient: Carmen Meza  Procedure(s) Performed: AN AD HOC LABOR EPIDURAL     Patient location during evaluation: Mother Baby Anesthesia Type: Epidural Level of consciousness: awake and alert Pain management: pain level controlled Vital Signs Assessment: post-procedure vital signs reviewed and stable Respiratory status: spontaneous breathing, nonlabored ventilation and respiratory function stable Cardiovascular status: stable Postop Assessment: no headache, no backache and epidural receding Anesthetic complications: no    Last Vitals:  Vitals:   10/01/19 0228 10/01/19 0649  BP: 110/75 110/71  Pulse: 99 88  Resp: 16 16  Temp: 37.2 C 36.9 C  SpO2: 99% 99%    Last Pain:  Vitals:   10/01/19 0650  TempSrc:   PainSc: 3    Pain Goal:                   Dakwan Pridgen

## 2019-10-01 NOTE — Lactation Note (Signed)
This note was copied from a baby's chart. Lactation Consultation Note  Patient Name: Carmen Meza [id number] Date: 10/01/2019 Reason for consult: Initial assessment;1st time breastfeeding;Term P1, 7 hour term female infant, infant is DAT+ Tools given : hand pump and breast shells due to mom right breast being flat. Per mom, infant latched 20 minutes L&D and last breastfed at 0230 am, LC entered room infant was cuing to breastfeed.  LC asked mom to do breast stimulation and expressed a small amount of colostrum out of right breast prior to latching infant, LC did not sustained latch and keep want to slide unto mom's nipple tip. LC discussed with mom to keep infant close to breast, nose and chin touching, top lip flanged out and lower jaw extended downward. LC discussed mom holding breast in 'U or C hold to provide breast support to form a wedge and  mold breast in infant's moth to form a ceil to help with infant's  latch, there were a few deep latches mom and dad could tell a difference with a deep and shallow latch. Parents understood that infant top lip should be flange out, angle of infant mouth should look like a K at the breast, lower jaw extended downward. Infant had large emesis "clear mucous" after breastfeeding for about 7 minutes and afterwards infant was no longer interested in latching. Mom did hand express 5 mls of colostrum that was spoon fed to infant and mom will continue to work on latching infant at breast. Mom know to call RN or LC to help assist with latch. Mom knows to breastfeed infant according to hunger cues, on demand and not exceed 3 hours without breastfeeding infant. Mom will pre-pump breast prior to latching infant at breast and will hand express if infant doesn't latch well and give back EBM by spoon. Mom understands to wear breast shells in bra during the day and not at night.  Reviewed Baby & Me book's Breastfeeding Basics.  Mom made aware of O/P services,  breastfeeding support groups, community resources, and our phone # for post-discharge questions.   Maternal Data Formula Feeding for Exclusion: No Has patient been taught Hand Expression?: Yes Does the patient have breastfeeding experience prior to this delivery?: No  Feeding Feeding Type: Breast Fed  LATCH Score Latch: Repeated attempts needed to sustain latch, nipple held in mouth throughout feeding, stimulation needed to elicit sucking reflex.  Audible Swallowing: A few with stimulation  Type of Nipple: Flat  Comfort (Breast/Nipple): Soft / non-tender  Hold (Positioning): Assistance needed to correctly position infant at breast and maintain latch.  LATCH Score: 6  Interventions Interventions: Breast feeding basics reviewed;Breast compression;Adjust position;Assisted with latch;Hand pump;Support pillows;Skin to skin;Breast massage;Position options;Expressed milk;Hand express;Pre-pump if needed;Shells  Lactation Tools Discussed/Used Tools: Shells;Pump Shell Type: Other (comment)(right breast is flat.) WIC Program: Yes Pump Review: Setup, frequency, and cleaning;Milk Storage Initiated by:: Carmen Meza, IBCLC Date initiated:: 10/01/19   Consult Status Consult Status: Follow-up Date: 10/01/19 Follow-up type: In-patient    Carmen Meza 10/01/2019, 6:53 AM

## 2019-10-01 NOTE — Lactation Note (Signed)
This note was copied from a baby's chart. Lactation Consultation Note  Patient Name: Carmen Meza Today's Date: 10/01/2019   Upon entering room, FOB had baby STS in the chair and MOB just finished showering. [facility] student greeted the family and checked in on how they were doing thus far. MOB said things were going good so far but there was some breast pains when baby latches on. Baby feeds for short spans but frequently according to MOB. [facility] student expressed that they could call out for the next feeding so she could assist in latching baby. [facility] student also told MOB that after the feedings she should hand express or pump and feed the EBM back to baby. MOB stated that hand expressing took a lot of time so [facility] student recommended bringing pump parts in so MOB could start pumping after each feed. MOB was happy about that. [facility] student asked if MOB had pump at home and MOB stated she did not but could get on through [facility]. [facility] student explained that she could put a [facility] referral in and get them started with pumping while in hospital.   FOB mentioned them being curious about bottle feeding. [facility] student asked what the feeding plan was when the family gets home so that she could tailor the education to the plan. MOB stated that she did not have a solidified plan yet but wanted to breastfeed and bottle feed EBM as well. She also mentioned what it would look like to get baby on formula if breastfeeding did not work for them. [facility] student explained they could feed at the breast and bottle feed EBM. Formula could also be given if they change their feeding plan and that it take babies time to adjust to formula. [facility] student told parents to think about their feeding goals and she will come back to tailor the information to their feeding goal as not to overwhelm them.   [facility] student will return with pump material, but has explained feeding cues, cluster feeding, stomach size, lactogenesis II, 8-12xs in 24 hours, and hand  expressing/pumping after each feed.   Thersa Salt Naketa Daddario 10/01/2019, 12:44 PM

## 2019-10-02 MED ORDER — ACETAMINOPHEN 325 MG PO TABS: 650.0000 mg | ORAL_TABLET | Freq: Four times a day (QID) | ORAL | 0 refills | Status: DC | PRN

## 2019-10-02 MED ORDER — IBUPROFEN 600 MG PO TABS: 600.0000 mg | ORAL_TABLET | Freq: Four times a day (QID) | ORAL | 0 refills | Status: AC

## 2019-10-02 MED ORDER — SENNOSIDES-DOCUSATE SODIUM 8.6-50 MG PO TABS: 2.0000 | ORAL_TABLET | ORAL | 0 refills | Status: AC

## 2019-10-02 NOTE — Lactation Note (Signed)
This note was copied from a baby's chart. Lactation Consultation Note: Mother is a P71 , infant is 15 hours old and is now at 4 % weight loss   Mother reports that she is mostly bottle feeding . Mother reports that she really wants to breast feed infant. Mother was offered assistance with latching. Infant is asleep in crib at this time. Advised mother to page for assistance with infant wakes.  Encouraged mother to use DEBP at the breast side. Lots of teaching with mother on supply and demand. Mother given support and encouragement.   Mother to continue to cue base feed infant and feed at least 8-12 times or more in 24 hours and advised to allow for cluster feeding infant as needed.   Mother to continue to due STS. Mother is aware of available LC services at [facility], BFSG'S, OP Dept, and phone # for questions or concerns about breastfeeding.  Mother receptive to all teaching and plan of care.    Patient Name: Girl Chloris Marcoux [id number] Date: 10/02/2019 Reason for consult: Follow-up assessment   Maternal Data    Feeding Feeding Type: Bottle Fed - Formula Nipple Type: Extra Slow Flow  LATCH Score                   Interventions Interventions: Hand pump;DEBP  Lactation Tools Discussed/Used     Consult Status Consult Status: PRN    Michel Bickers 10/02/2019, 1:39 PM

## 2019-10-11 ENCOUNTER — Other Ambulatory Visit: Payer: Self-pay | Admitting: Family Medicine

## 2019-10-17 ENCOUNTER — Other Ambulatory Visit: Payer: Self-pay | Admitting: Family Medicine

## 2019-10-21 ENCOUNTER — Other Ambulatory Visit: Payer: Self-pay | Admitting: Family Medicine

## 2019-10-29 ENCOUNTER — Telehealth (INDEPENDENT_AMBULATORY_CARE_PROVIDER_SITE_OTHER): Payer: Medicaid Other | Admitting: Advanced Practice Midwife

## 2019-10-29 DIAGNOSIS — R87612 Low grade squamous intraepithelial lesion on cytologic smear of cervix (LGSIL): Secondary | ICD-10-CM

## 2019-10-29 DIAGNOSIS — Z3009 Encounter for other general counseling and advice on contraception: Secondary | ICD-10-CM

## 2019-10-29 NOTE — Progress Notes (Signed)
      POSTPARTUM VIRTUAL VISIT ENCOUNTER NOTE  Provider location: [facility] [place]   I connected with Carmen Meza on 10/29/19 at  1:00 PM EDT by MyChart Video Encounter at home and verified that I am speaking with the correct person using two identifiers.   I discussed the limitations, risks, security and privacy concerns of performing an evaluation and management service virtually and the availability of in person appointments. I also discussed with the patient that there may be a patient responsible charge related to this service. The patient expressed understanding and agreed to proceed.     Subjective: Carmen Meza is a 29 y.o. G61P1001 female who presents for a postpartum visit. She is 4 weeks postpartum following a normal spontaneous vaginal delivery.  I have fully reviewed the prenatal and intrapartum course. The delivery was at 41 gestational weeks.  Anesthesia: epidural. Postpartum course has been unremarkable. Baby is doing well. Baby is feeding by bottle - Gerber Gentle . Bleeding no bleeding. Bowel function is normal. Bladder function is normal. Patient is sexually active. Contraception method is none. Postpartum depression screening: negative.  The following portions of the patient's history were reviewed and updated as appropriate: allergies, current medications, past family history, past medical history, past social history, past surgical history and problem list.  Review of Systems Pertinent items noted in HPI and remainder of comprehensive ROS otherwise negative.    Objective:  Last menstrual period 12/17/2018, unknown if currently breastfeeding.   Assessment:   Normal postpartum exam. Pap smear not done at today's visit.   1. Postpartum care following vaginal delivery --Doing well, bonding well, bottle feeding without difficulty.  See notes below.  2. Encounter for counseling regarding contraception --Pt does not desire  contraception. Discussed options, including nonhormonal options. Pt has irregular menses so natural family planning may be more difficulty.  Pt is sexually active and does not desire pregnancy prevention at this time.   3. LGSIL on Pap smear of cervix --LSIL on Pap 02/2019 with negative HPV.  Per ASCCP guidelines, Pap in 1 year. Discussed with pt at today's visit.   Plan:   Essential components of care per ACOG recommendations:  1.  Mood and well being: Patient with negative depression screening today. Reviewed local resources for support.  - Patient does not use tobacco.  - hx of drug use? No    2. Infant care and feeding:  -Patient currently breastmilk feeding? No  -Social determinants of health (SDOH) reviewed in EPIC. No concerns   3. Sexuality, contraception and birth spacing - Patient is Ok with a pregnancy in the next year.   - Reviewed forms of contraception in tiered fashion. Patient desired no method today.    4. Sleep and fatigue -Encouraged family/partner/community support of 4 hrs of uninterrupted sleep to help with mood and fatigue  5. Physical Recovery  - Discussed patients delivery and complications - Patient had a second degree degree laceration, perineal healing reviewed. Patient expressed understanding - Patient has urinary incontinence? No    - Patient is safe to resume physical and sexual activity  6.  Health Maintenance - Last pap smear done 02/2019 and was abnormal with LSIL with negative HPV. F/U Pap in 1 year.    I provided 10 minutes of face-to-face time during this encounter.  Sharen Counter, CNM Center for [employer], [facility]

## 2019-11-08 ENCOUNTER — Other Ambulatory Visit: Payer: Self-pay

## 2019-11-08 ENCOUNTER — Encounter: Payer: Self-pay | Admitting: Obstetrics

## 2019-11-08 ENCOUNTER — Ambulatory Visit (HOSPITAL_BASED_OUTPATIENT_CLINIC_OR_DEPARTMENT_OTHER): Payer: Medicaid Other | Admitting: Obstetrics

## 2019-11-08 ENCOUNTER — Other Ambulatory Visit (HOSPITAL_COMMUNITY)
Admission: RE | Admit: 2019-11-08 | Discharge: 2019-11-08 | Disposition: A | Discharge status: Home / Self Care | Payer: Medicaid Other | Source: Ambulatory Visit | Attending: Obstetrics | Admitting: Obstetrics

## 2019-11-08 VITALS — BP 111/72 | HR 77 | Wt 182.0 lb

## 2019-11-08 DIAGNOSIS — N898 Other specified noninflammatory disorders of vagina: Secondary | ICD-10-CM | POA: Diagnosis present

## 2019-11-08 DIAGNOSIS — S01512S Laceration without foreign body of oral cavity, sequela: Secondary | ICD-10-CM

## 2019-11-08 DIAGNOSIS — O9089 Other complications of the puerperium, not elsewhere classified: Secondary | ICD-10-CM

## 2019-11-08 MED ORDER — CLINDAMYCIN HCL 300 MG PO CAPS: 300.0000 mg | ORAL_CAPSULE | Freq: Three times a day (TID) | ORAL | 0 refills | Status: DC

## 2019-11-08 NOTE — Progress Notes (Signed)
Pt is about 6 weeks PP.  Pt recently noticed changes in her incision- redness, tenderness, did have some drainage.   Pt states she did return to work recently.

## 2019-11-08 NOTE — Progress Notes (Signed)
    Post Partum Visit Note  Carmen Meza is a 29 y.o. G59P1001 female who presents for a postpartum visit. She is 6 weeks postpartum following a normal spontaneous vaginal delivery.  I have fully reviewed the prenatal and intrapartum course. The delivery was at 41 gestational weeks.  Anesthesia: epidural. Postpartum course has been complicated by incision problem. Baby is doing well. Baby is feeding by bottle - gerber good start. Bleeding no bleeding. Bowel function is normal. Bladder function is normal. Patient is sexually active. Contraception method is none. Postpartum depression screening: negative, score 0 .  The following portions of the patient's history were reviewed and updated as appropriate: allergies, current medications, past family history, past medical history, past social history, past surgical history and problem list.  Review of Systems A comprehensive review of systems was negative except for: Genitourinary: positive for pain in area of vaginal laceration    Objective:  Blood pressure 111/72, pulse 77, weight 182 lb (82.6 kg), last menstrual period 12/17/2018, unknown if currently breastfeeding.  General:  alert and no distress   Breasts:  inspection negative, no nipple discharge or bleeding, no masses or nodularity palpable  Lungs: clear to auscultation bilaterally  Heart:  regular rate and rhythm, S1, S2 normal, no murmur, click, rub or gallop  Abdomen: soft, non-tender; bowel sounds normal; no masses,  no organomegaly   Vulva:  positive for right lower labial minora swelling and tenderness  Vagina: vagina positive for mild swelling and tenderness of right lower labia    Assessment:   1. Vaginal discharge Rx: - Cervicovaginal ancillary only( [place])  2. Laceration of labial vestibule, sequela Rx: - clindamycin (CLEOCIN) 300 MG capsule; Take 1 capsule (300 mg total) by mouth 3 (three) times daily.  Dispense: 21 capsule; Refill: 0   Plan:   Essential  components of care per ACOG recommendations:  1.  Mood and well being: Patient with negative depression screening today. Reviewed local resources for support.  - Patient does not use tobacco. - hx of drug use? No    2. Infant care and feeding:  -Patient currently breastmilk feeding? No If breastmilk feeding discussed return to work and pumping. If needed, patient was provided letter for work to allow for every 2-3 hr pumping breaks, and to be granted a private location to express breastmilk and refrigerated area to store breastmilk. Reviewed importance of draining breast regularly to support lactation. -Social determinants of health (SDOH) reviewed in EPIC. No concerns.  3. Sexuality, contraception and birth spacing - Patient does not want a pregnancy in the next year.  Desired family size is 2 children.  - Reviewed forms of contraception in tiered fashion. Patient desired no method today.   - Discussed birth spacing of 18 months  4. Sleep and fatigue -Encouraged family/partner/community support of 4 hrs of uninterrupted sleep to help with mood and fatigue  5. Physical Recovery  - Discussed patients delivery - Patient had right labial and periurethral lacerations. - Patient has urinary incontinence? No - Patient is safe to resume physical and sexual activity  6.  Health Maintenance - Last pap smear done September 2020 and was normal with negative HPV.   7. No Chronic Disease   Coral Ceo, MD Center for [facility], [facility] 11/08/19

## 2019-11-11 LAB — CERVICOVAGINAL ANCILLARY ONLY
Bacterial Vaginitis (gardnerella): NEGATIVE
Candida Glabrata: NEGATIVE
Candida Vaginitis: NEGATIVE
Comment: NEGATIVE
Comment: NEGATIVE
Comment: NEGATIVE

## 2019-12-05 DIAGNOSIS — Z419 Encounter for procedure for purposes other than remedying health state, unspecified: Secondary | ICD-10-CM | POA: Diagnosis not present

## 2020-01-05 DIAGNOSIS — Z419 Encounter for procedure for purposes other than remedying health state, unspecified: Secondary | ICD-10-CM | POA: Diagnosis not present

## 2020-02-05 DIAGNOSIS — Z419 Encounter for procedure for purposes other than remedying health state, unspecified: Secondary | ICD-10-CM | POA: Diagnosis not present

## 2020-03-06 DIAGNOSIS — Z419 Encounter for procedure for purposes other than remedying health state, unspecified: Secondary | ICD-10-CM | POA: Diagnosis not present

## 2020-03-08 IMAGING — US US MFM OB DETAIL+14 WK
1 series · 13 of 28 positions shown · non-contrast
Comparison: none

[Series 1: us mfm ob detail+14 wk · 13 of 71 slices shown]
[im 3/71]
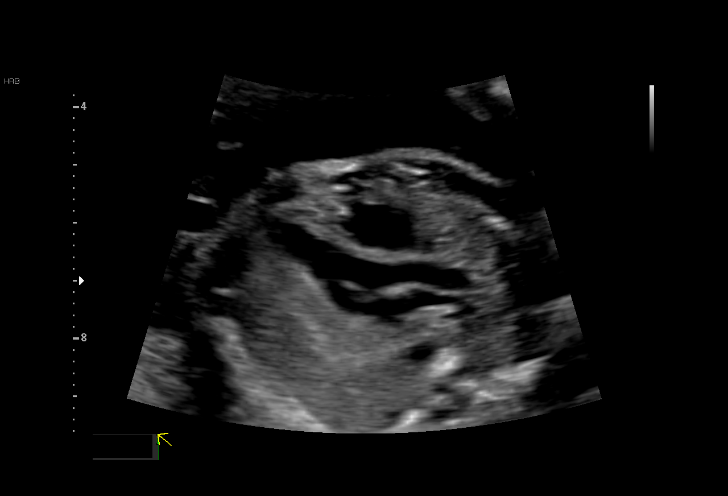
[im 8/71]
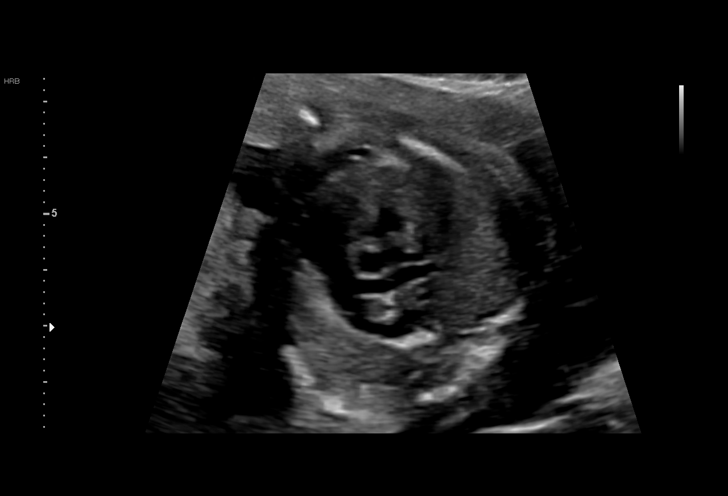
[im 13/71]
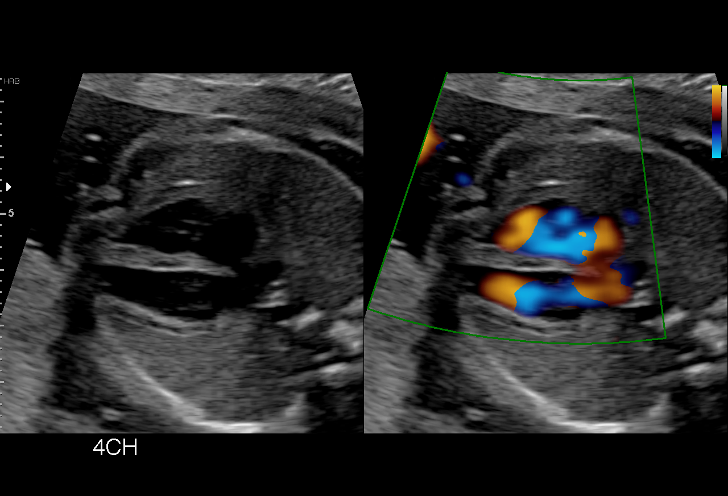
[im 19/71]
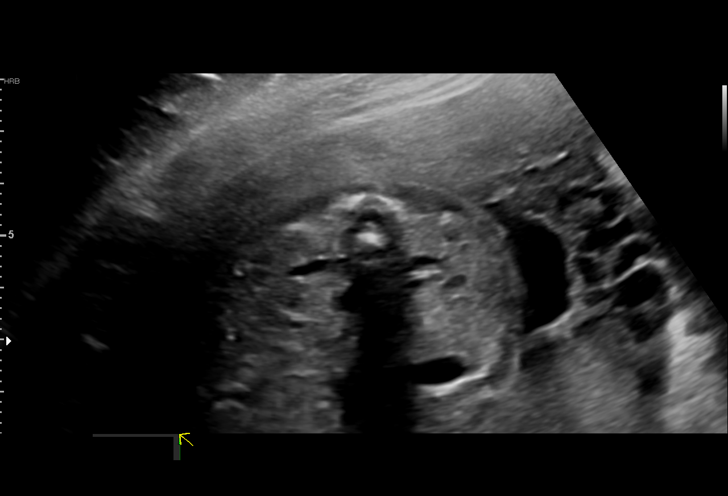
[im 24/71]
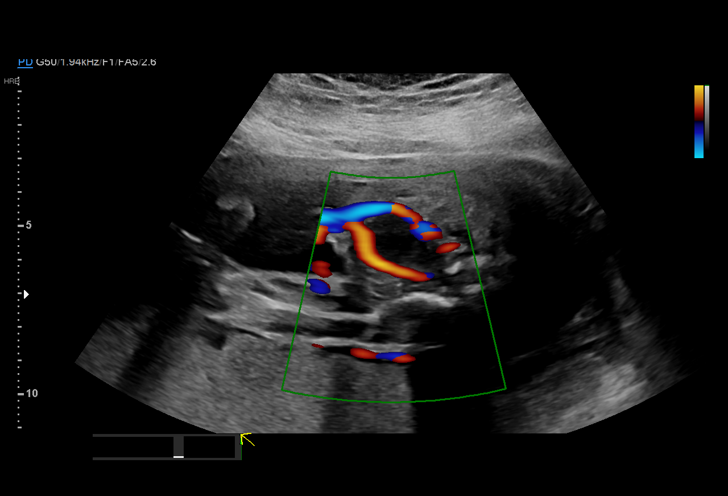
[im 29/71]
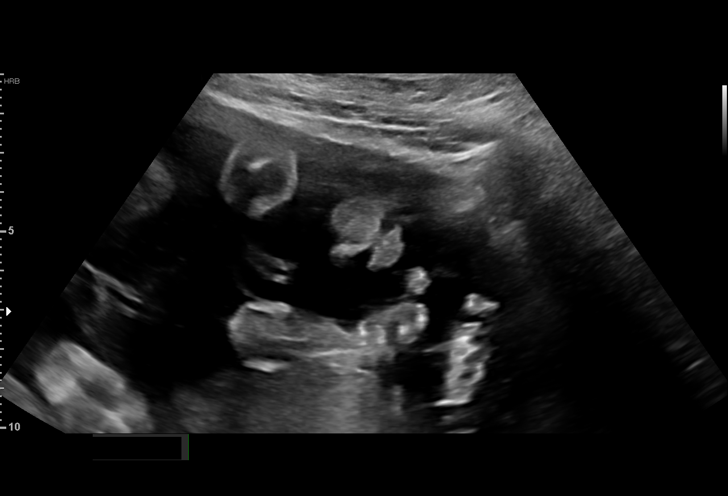
[im 37/71]
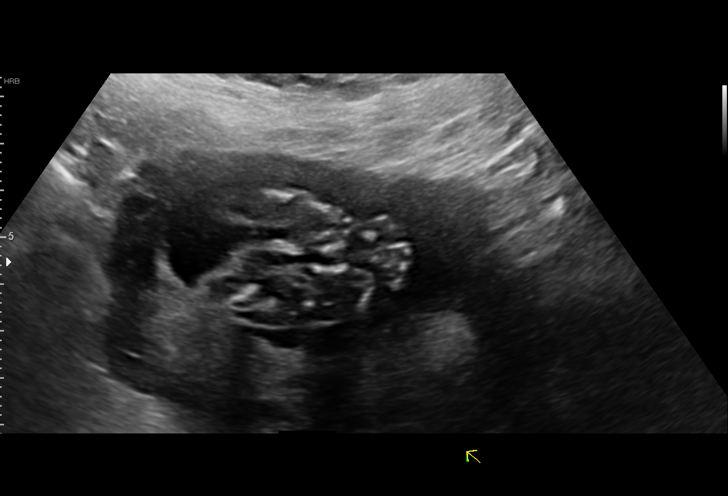
[im 42/71]
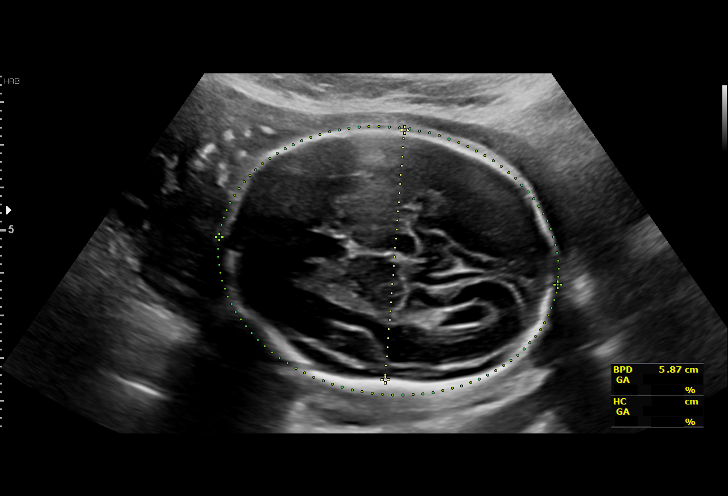
[im 47/71]
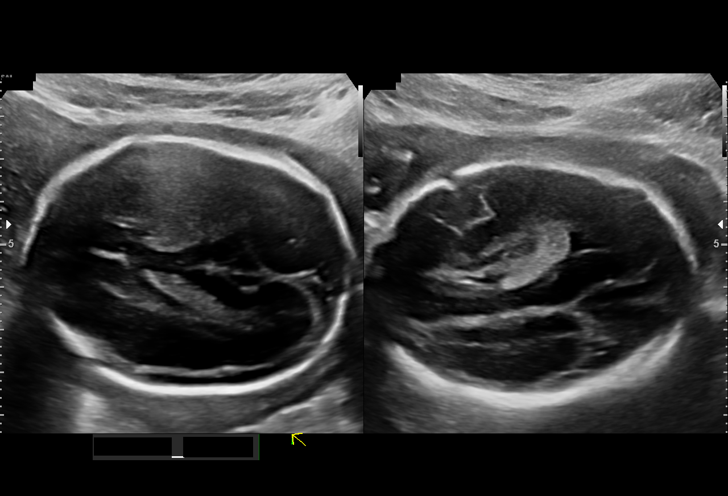
[im 52/71]
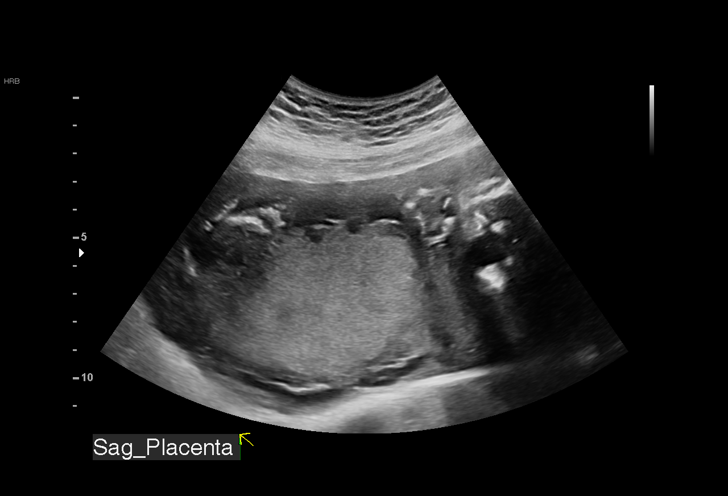
[im 58/71]
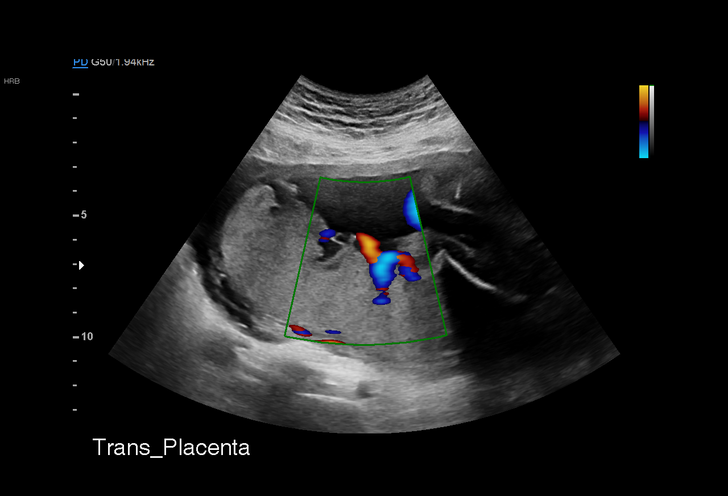
[im 63/71]
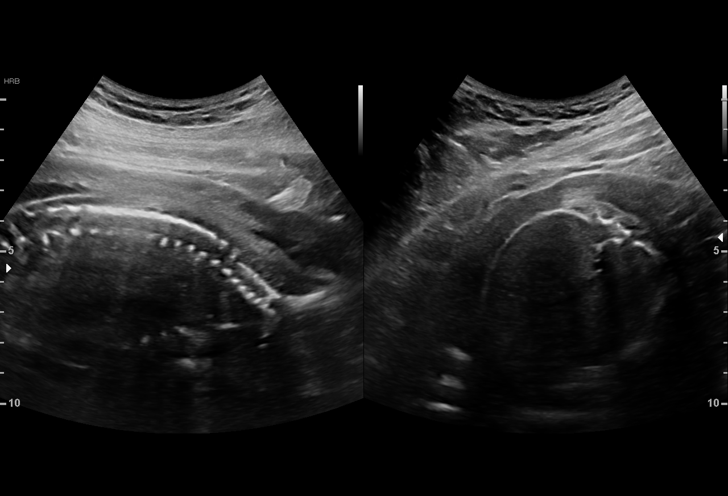
[im 68/71]
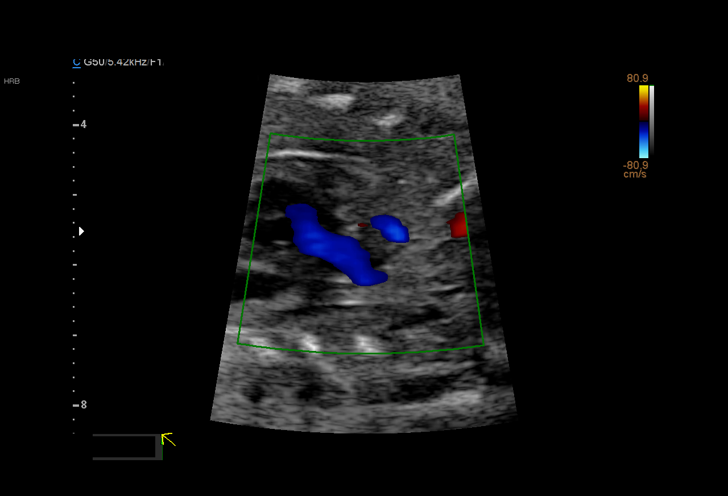

[13 of 28 positions shown; findings below may reference images not displayed]

----------------------------------------------------------------------

 ----------------------------------------------------------------------
Indications

  Encounter for antenatal screening for
  malformations (nml 1st Trimester Screen,
  negative AFP)
  Obesity complicating pregnancy, second
  trimester BMI (31)
  24 weeks gestation of pregnancy
  Insufficient Prenatal Care (gap in prenatal
  care due to insurance)
 ----------------------------------------------------------------------
Vital Signs

 BMI:
Fetal Evaluation

 Num Of Fetuses:         1
 Cardiac Activity:       Observed
 Presentation:           Cephalic
 Placenta:               Posterior
 P. Cord Insertion:      Visualized, central

 Amniotic Fluid
 AFI FV:      Within normal limits

                             Largest Pocket(cm)

Biometry

 BPD:      58.2  mm     G. Age:  23w 6d         23  %    CI:        68.84   %    70 - 86
                                                         FL/HC:      19.0   %    18.7 -
 HC:      224.1  mm     G. Age:  24w 3d         31  %    HC/AC:      1.08        1.05 -
 AC:      206.8  mm     G. Age:  25w 2d         67  %    FL/BPD:     73.0   %    71 - 87
 FL:       42.5  mm     G. Age:  23w 6d         21  %    FL/AC:      20.6   %    20 - 24
 CER:      26.9  mm     G. Age:  24w 3d         51  %
 LV:        3.7  mm
 CM:        4.4  mm

 Est. FW:     714  gm      1 lb 9 oz     48  %
OB History

 Gravidity:    1
Gestational Age

 LMP:           24w 3d        Date:  12/17/18                 EDD:   09/23/19
 U/S Today:     24w 3d                                        EDD:   09/23/19
 Best:          24w 3d     Det. By:  LMP  (12/17/18)          EDD:   09/23/19
Anatomy

 Cranium:               Appears normal         Aortic Arch:            Appears normal
 Cavum:                 Appears normal         Ductal Arch:            Appears normal
 Ventricles:            Appears normal         Diaphragm:              Appears normal
 Choroid Plexus:        Appears normal         Stomach:                Appears normal, left
                                                                       sided
 Cerebellum:            Appears normal         Abdomen:                Appears normal
 Posterior Fossa:       Appears normal         Abdominal Wall:         Appears nml (cord
                                                                       insert, abd wall)
 Nuchal Fold:           Not applicable (>20    Cord Vessels:           Appears normal (3
                        wks GA)                                        vessel cord)
 Face:                  Appears normal         Kidneys:                Appear normal
                        (orbits and profile)
 Lips:                  Appears normal         Bladder:                Appears normal
 Thoracic:              Appears normal         Spine:                  Appears normal
 Heart:                 Appears normal         Upper Extremities:      Appears normal
                        (4CH, axis, and
                        situs)
 RVOT:                  Appears normal         Lower Extremities:      Appears normal
 LVOT:                  Appears normal

 Other:  Female gender Heels and 5th digit visualized. Technically difficult due
         to maternal habitus and fetal position.
Cervix Uterus Adnexa

 Cervix
 Not visualized (advanced GA >17wks)
Impression

 G1 P0.  Patient recently transferred her prenatal care to see
 [facility].  On first of MRSA screening, the risks of fetal
 aneuploidies are not increased.  MSAFP screening showed
 low risk for open neural tube defects.

 Patient reports no chronic medical conditions.

 We performed fetal anatomy scan. No makers of
 aneuploidies or fetal structural defects are seen. Fetal
 biometry is consistent with her previously-established dates.
 Amniotic fluid is normal and good fetal activity is seen.
 Patient understands the limitations of ultrasound in detecting
 fetal anomalies.
Recommendations

 Follow-up scans as clinically indicated.
                 [place], [place]

## 2020-04-06 DIAGNOSIS — Z419 Encounter for procedure for purposes other than remedying health state, unspecified: Secondary | ICD-10-CM | POA: Diagnosis not present

## 2020-05-06 DIAGNOSIS — Z419 Encounter for procedure for purposes other than remedying health state, unspecified: Secondary | ICD-10-CM | POA: Diagnosis not present

## 2020-06-06 DIAGNOSIS — Z419 Encounter for procedure for purposes other than remedying health state, unspecified: Secondary | ICD-10-CM | POA: Diagnosis not present

## 2020-06-25 DIAGNOSIS — Z789 Other specified health status: Secondary | ICD-10-CM | POA: Diagnosis not present

## 2020-06-25 DIAGNOSIS — Z114 Encounter for screening for human immunodeficiency virus [HIV]: Secondary | ICD-10-CM | POA: Diagnosis not present

## 2020-06-25 DIAGNOSIS — J358 Other chronic diseases of tonsils and adenoids: Secondary | ICD-10-CM | POA: Diagnosis not present

## 2020-06-25 DIAGNOSIS — Z1159 Encounter for screening for other viral diseases: Secondary | ICD-10-CM | POA: Diagnosis not present

## 2020-06-25 DIAGNOSIS — Z131 Encounter for screening for diabetes mellitus: Secondary | ICD-10-CM | POA: Diagnosis not present

## 2020-06-25 DIAGNOSIS — Z13220 Encounter for screening for lipoid disorders: Secondary | ICD-10-CM | POA: Diagnosis not present

## 2020-06-25 DIAGNOSIS — R5383 Other fatigue: Secondary | ICD-10-CM | POA: Diagnosis not present

## 2020-06-25 DIAGNOSIS — E559 Vitamin D deficiency, unspecified: Secondary | ICD-10-CM | POA: Diagnosis not present

## 2020-06-25 DIAGNOSIS — Z0000 Encounter for general adult medical examination without abnormal findings: Secondary | ICD-10-CM | POA: Diagnosis not present

## 2020-07-07 DIAGNOSIS — Z419 Encounter for procedure for purposes other than remedying health state, unspecified: Secondary | ICD-10-CM | POA: Diagnosis not present

## 2020-07-09 DIAGNOSIS — Z87898 Personal history of other specified conditions: Secondary | ICD-10-CM | POA: Diagnosis not present

## 2020-07-09 DIAGNOSIS — Z20822 Contact with and (suspected) exposure to covid-19: Secondary | ICD-10-CM | POA: Diagnosis not present

## 2020-07-09 DIAGNOSIS — R509 Fever, unspecified: Secondary | ICD-10-CM | POA: Diagnosis not present

## 2020-08-04 DIAGNOSIS — Z419 Encounter for procedure for purposes other than remedying health state, unspecified: Secondary | ICD-10-CM | POA: Diagnosis not present

## 2020-08-19 DIAGNOSIS — R070 Pain in throat: Secondary | ICD-10-CM | POA: Diagnosis not present

## 2020-08-19 DIAGNOSIS — J358 Other chronic diseases of tonsils and adenoids: Secondary | ICD-10-CM | POA: Diagnosis not present

## 2020-08-28 DIAGNOSIS — Z01818 Encounter for other preprocedural examination: Secondary | ICD-10-CM | POA: Diagnosis not present

## 2020-09-01 DIAGNOSIS — J3501 Chronic tonsillitis: Secondary | ICD-10-CM | POA: Diagnosis not present

## 2020-09-01 DIAGNOSIS — J358 Other chronic diseases of tonsils and adenoids: Secondary | ICD-10-CM | POA: Diagnosis not present

## 2020-09-04 DIAGNOSIS — Z419 Encounter for procedure for purposes other than remedying health state, unspecified: Secondary | ICD-10-CM | POA: Diagnosis not present

## 2020-10-04 DIAGNOSIS — Z419 Encounter for procedure for purposes other than remedying health state, unspecified: Secondary | ICD-10-CM | POA: Diagnosis not present

## 2020-11-04 DIAGNOSIS — Z419 Encounter for procedure for purposes other than remedying health state, unspecified: Secondary | ICD-10-CM | POA: Diagnosis not present

## 2020-12-04 DIAGNOSIS — Z419 Encounter for procedure for purposes other than remedying health state, unspecified: Secondary | ICD-10-CM | POA: Diagnosis not present

## 2021-01-04 DIAGNOSIS — Z419 Encounter for procedure for purposes other than remedying health state, unspecified: Secondary | ICD-10-CM | POA: Diagnosis not present

## 2021-02-04 DIAGNOSIS — Z419 Encounter for procedure for purposes other than remedying health state, unspecified: Secondary | ICD-10-CM | POA: Diagnosis not present

## 2021-03-06 DIAGNOSIS — Z419 Encounter for procedure for purposes other than remedying health state, unspecified: Secondary | ICD-10-CM | POA: Diagnosis not present

## 2021-04-06 DIAGNOSIS — Z419 Encounter for procedure for purposes other than remedying health state, unspecified: Secondary | ICD-10-CM | POA: Diagnosis not present

## 2021-04-19 ENCOUNTER — Ambulatory Visit: Payer: Medicaid Other | Admitting: Obstetrics and Gynecology

## 2021-05-06 DIAGNOSIS — Z419 Encounter for procedure for purposes other than remedying health state, unspecified: Secondary | ICD-10-CM | POA: Diagnosis not present

## 2021-06-06 DIAGNOSIS — Z419 Encounter for procedure for purposes other than remedying health state, unspecified: Secondary | ICD-10-CM | POA: Diagnosis not present

## 2021-07-07 DIAGNOSIS — Z419 Encounter for procedure for purposes other than remedying health state, unspecified: Secondary | ICD-10-CM | POA: Diagnosis not present

## 2021-08-04 DIAGNOSIS — Z419 Encounter for procedure for purposes other than remedying health state, unspecified: Secondary | ICD-10-CM | POA: Diagnosis not present

## 2021-08-27 ENCOUNTER — Ambulatory Visit (INDEPENDENT_AMBULATORY_CARE_PROVIDER_SITE_OTHER): Payer: Medicaid Other | Admitting: Podiatrist

## 2021-08-27 ENCOUNTER — Encounter: Payer: Self-pay | Admitting: Podiatrist

## 2021-08-27 ENCOUNTER — Ambulatory Visit (INDEPENDENT_AMBULATORY_CARE_PROVIDER_SITE_OTHER): Payer: Medicaid Other

## 2021-08-27 ENCOUNTER — Other Ambulatory Visit: Payer: Self-pay

## 2021-08-27 DIAGNOSIS — M779 Enthesopathy, unspecified: Secondary | ICD-10-CM | POA: Diagnosis not present

## 2021-08-27 DIAGNOSIS — S86312A Strain of muscle(s) and tendon(s) of peroneal muscle group at lower leg level, left leg, initial encounter: Secondary | ICD-10-CM | POA: Diagnosis not present

## 2021-08-27 NOTE — Patient Instructions (Signed)
Foot Sprain A foot sprain is an injury to one of the ligaments in the feet. Ligaments are strong tissues that connect bones to each other. The ligament can be stretched too much. In some cases, it may tear. A tear can be either partial or complete. The severity of the sprain depends on how much of the ligament was damaged or torn. What are the causes? This condition is usually caused by suddenly twisting or pivoting your foot. What increases the risk? You are more likely to develop this condition if: You play a sport, such as basketball or football. You exercise or play a sport without first warming up your muscles. You start a new workout or sport. You suddenly increase how long or hard you exercise or play a sport. You have injured your foot or ankle before. What are the signs or symptoms? Symptoms of this condition start soon after an injury and include: Pain, especially in the arch of your foot. Bruising. Swelling. Being unable to walk or use your foot to support body weight. How is this diagnosed? This condition is diagnosed with a medical history and physical exam. You may also have imaging tests, such as: X-rays to check for broken bones (fractures). An MRI to see if the ligament is torn. How is this treated? Treatment for this condition depends on the severity of the sprain. Mild sprains and major sprains can be treated with: Rest, ice, pressure (compression), and elevation (RICE). Elevation means raising your injured foot. Keeping your foot in a fixed position (immobilization) for a period of time. This is done if your ligament is overstretched or partially torn. Your health care provider will apply a bandage, splint, or walking boot to keep your foot from moving until it heals. Using crutches or a scooter for a few weeks to avoid bearing weight on your foot while it is healing. Physical therapy exercises to improve movement and strength in your foot. Major sprains may also be  treated with: Surgery. This is done if your ligament is fully torn and a procedure is needed to reconnect it to the bone. A cast or splint. This will be needed after surgery. A cast or splint will need to stay on your foot while it heals. Follow these instructions at home: If you have a bandage, splint, or boot: Wear it as told by your health care provider. Remove it only as told by your health care provider. Loosen it if your toes tingle, become numb, or turn cold and blue. Keep it clean and dry. If you have a cast: Do not put pressure on any part of the cast until it is fully hardened. This may take several hours. Do not stick anything inside the cast to scratch your skin. Doing that increases your risk for infection. Check the skin around the cast every day. Tell your health care provider about any concerns. You may put lotion on dry skin around the edges of the cast. Do not put lotion on the skin underneath the cast. Keep it clean and dry. Bathing Do not take baths, swim, or use a hot tub until your health care provider approves. Ask your health care provider if you may take showers. You may only be allowed to take sponge baths. If the bandage, splint, boot, or cast is not waterproof: Do not let it get wet. Cover it with a watertight covering when you take a bath or shower. Managing pain, stiffness, and swelling  If directed, put ice on the injured   area. To do this: If you have a removable bandage, splint, or boot, remove it as told by your health care provider. Put ice in a plastic bag. Place a towel between your skin and the bag, or between your cast and the bag. Leave the ice on for 20 minutes, 2-3 times per day. Remove the ice if your skin turns bright red. This is very important. If you cannot feel pain, heat, or cold, you have a greater risk of damage to the area. Move your toes often to reduce stiffness and swelling. Elevate the injured area above the level of your heart while  you are sitting or lying down. Activity Do not use the injured foot to support your body weight until your health care provider says that you can. Use crutches or a scooter as told by your health care provider. Ask your health care provider what activities are safe for you. Do exercises as told by your health care provider. Gradually increase how much and how far you walk until your health care provider says it is safe to return to full activity. Driving Ask your health care provider if the medicine prescribed to you requires you to avoid driving or using machinery. Ask your health care provider when it is safe to drive if you have a bandage, splint, boot, or cast on your foot. General instructions Take over-the-counter and prescription medicines only as told by your health care provider. When you can walk without pain, wear supportive shoes that have stiff soles. Do not wear flip-flops. Do not walk barefoot. Keep all follow-up visits. This is important. Contact a health care provider if: Medicine does not help your pain. Your bruising or swelling gets worse or does not get better with treatment. Your splint, boot, or cast is damaged. Get help right away if: You develop severe numbness or tingling in your foot. Your foot turns blue, white, or gray, and it feels cold. Summary A foot sprain is an injury to one of the ligaments in the feet. Ligaments are strong tissues that connect bones to each other. You may need a bandage, splint, boot, or cast to support your foot while it heals. Sometimes, surgery may be needed. You may need physical therapy exercises to improve movement and strength in your foot. This information is not intended to replace advice given to you by your health care provider. Make sure you discuss any questions you have with your health care provider. Document Revised: 09/13/2019 Document Reviewed: 09/13/2019 Elsevier Patient Education  2022 [employer]  

## 2021-08-27 NOTE — Progress Notes (Signed)
?Chief Complaint  ?Patient presents with  ? Ankle Pain  ?  Left ankle pain located at the lateral side of ankle radiating to lower leg x 5 months.   ?  ? ?HPI: Patient is 31 y.o. female who presents today for pain on the left ankle and left foot for the last 5 or so months duration.  She denies any significant, or injury to the foot.  She states that she felt a slight tinge in her foot while walking about 5 months ago and its continued to get more and more painful and swollen.  She works on her feet and has to stand for long periods of time. ? ?Patient Active Problem List  ? Diagnosis Date Noted  ? 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 ? ?Current Outpatient Medications on File Prior to Visit  ?Medication Sig Dispense Refill  ? Blood Pressure Monitoring (BLOOD PRESSURE KIT) DEVI 1 kit by Does not apply route once a week. Check BP regularly and record readings into the [employer] App.  Large Cuff.  DX O90.0 1 each 0  ? clindamycin (CLEOCIN) 300 MG capsule Take 1 capsule (300 mg total) by mouth 3 (three) times daily. 21 capsule 0  ? ibuprofen (ADVIL) 600 MG tablet Take 1 tablet (600 mg total) by mouth every 6 (six) hours. 30 tablet 0  ? Prenatal Vit-Fe Fumarate-FA (PRENATAL PO) Take by mouth.    ? senna-docusate (SENOKOT-S) 8.6-50 MG tablet Take 2 tablets by mouth daily. 30 tablet 0  ? ?No current facility-administered medications on file prior to visit.  ? ? ?Allergies  ?Allergen Reactions  ? Penicillins   ? ? ?Review of Systems ?No fevers, chills, nausea, muscle aches, no difficulty breathing, no calf pain, no chest pain or shortness of breath. ? ? ?Physical Exam ? ?GENERAL APPEARANCE: Alert, conversant. Appropriately groomed. No acute distress.  ? ?VASCULAR: Pedal pulses palpable DP and PT bilateral.  Capillary refill time is immediate to all digits,  Proximal to distal cooling it warm to warm.  Digital perfusion adequate.  ? ?NEUROLOGIC: sensation is intact to 5.07 monofilament at 5/5 sites  bilateral.  Light touch is intact bilateral, vibratory sensation intact bilateral ? ?MUSCULOSKELETAL: acceptable muscle strength, tone and stability bilateral.  No gross boney pedal deformities noted.  Pain at the base of the fifth metatarsal which extends proximally just distal to the lateral malleolus along the course of the peroneal tendon is noted. ? ?DERMATOLOGIC: skin is warm, supple, and dry.  Color, texture, and turgor of skin within normal limits.  No open wounds are noted.  No preulcerative lesions are seen.  Digital nails are asymptomatic.   ? ?X-ray evaluation 3 views of the left ankle are obtained.  Joint space is normal no sign of arthritic changes are noted.  No acute osseous abnormalities are seen on x-ray. ? ?Assessment  ? ?  ICD-10-CM   ?1. Tendonitis  M77.9 DG Ankle Complete Left  ?  ?2. Strain of peroneal tendon of left foot, initial encounter  V74.827M   ?  ? ? ? ?Plan ? ?Discussed exam and x-ray findings with the patient.  This is likely a soft tissue injury of the peroneal tendons and I recommended a ankle brace for her to use for the next 4 to 6 weeks while standing at work.  I would a prescription for her to obtain this ankle brace at [facility] on [address].  She will be seen back in 4 to  6 weeks for recheck and if any problems or concerns arise prior to that visit she is instructed to call. ?

## 2021-08-30 DIAGNOSIS — M25372 Other instability, left ankle: Secondary | ICD-10-CM | POA: Diagnosis not present

## 2021-09-04 DIAGNOSIS — Z419 Encounter for procedure for purposes other than remedying health state, unspecified: Secondary | ICD-10-CM | POA: Diagnosis not present

## 2021-10-04 DIAGNOSIS — Z419 Encounter for procedure for purposes other than remedying health state, unspecified: Secondary | ICD-10-CM | POA: Diagnosis not present

## 2021-11-04 DIAGNOSIS — Z419 Encounter for procedure for purposes other than remedying health state, unspecified: Secondary | ICD-10-CM | POA: Diagnosis not present

## 2021-12-04 DIAGNOSIS — Z419 Encounter for procedure for purposes other than remedying health state, unspecified: Secondary | ICD-10-CM | POA: Diagnosis not present

## 2022-01-04 DIAGNOSIS — Z419 Encounter for procedure for purposes other than remedying health state, unspecified: Secondary | ICD-10-CM | POA: Diagnosis not present

## 2022-02-04 DIAGNOSIS — Z419 Encounter for procedure for purposes other than remedying health state, unspecified: Secondary | ICD-10-CM | POA: Diagnosis not present

## 2022-02-10 DIAGNOSIS — J019 Acute sinusitis, unspecified: Secondary | ICD-10-CM | POA: Diagnosis not present

## 2022-03-06 DIAGNOSIS — Z419 Encounter for procedure for purposes other than remedying health state, unspecified: Secondary | ICD-10-CM | POA: Diagnosis not present

## 2022-03-23 DIAGNOSIS — R09A2 Foreign body sensation, throat: Secondary | ICD-10-CM | POA: Diagnosis not present

## 2022-03-23 DIAGNOSIS — K219 Gastro-esophageal reflux disease without esophagitis: Secondary | ICD-10-CM | POA: Diagnosis not present

## 2022-04-06 DIAGNOSIS — Z419 Encounter for procedure for purposes other than remedying health state, unspecified: Secondary | ICD-10-CM | POA: Diagnosis not present

## 2022-05-06 DIAGNOSIS — Z419 Encounter for procedure for purposes other than remedying health state, unspecified: Secondary | ICD-10-CM | POA: Diagnosis not present

## 2022-05-08 DIAGNOSIS — Z20822 Contact with and (suspected) exposure to covid-19: Secondary | ICD-10-CM | POA: Diagnosis not present

## 2022-05-08 DIAGNOSIS — R059 Cough, unspecified: Secondary | ICD-10-CM | POA: Diagnosis not present

## 2022-06-16 DIAGNOSIS — K219 Gastro-esophageal reflux disease without esophagitis: Secondary | ICD-10-CM | POA: Insufficient documentation

## 2023-01-12 DIAGNOSIS — G35D Multiple sclerosis, unspecified: Secondary | ICD-10-CM | POA: Insufficient documentation

## 2023-01-12 DIAGNOSIS — G35 Multiple sclerosis: Secondary | ICD-10-CM | POA: Insufficient documentation

## 2023-07-18 ENCOUNTER — Ambulatory Visit (INDEPENDENT_AMBULATORY_CARE_PROVIDER_SITE_OTHER): Payer: 59

## 2023-07-18 ENCOUNTER — Ambulatory Visit (INDEPENDENT_AMBULATORY_CARE_PROVIDER_SITE_OTHER): Payer: 59 | Admitting: Podiatry

## 2023-07-18 ENCOUNTER — Encounter: Payer: Self-pay | Admitting: Podiatry

## 2023-07-18 DIAGNOSIS — S99922D Unspecified injury of left foot, subsequent encounter: Secondary | ICD-10-CM

## 2023-07-18 DIAGNOSIS — S99921S Unspecified injury of right foot, sequela: Secondary | ICD-10-CM

## 2023-07-18 DIAGNOSIS — M2061 Acquired deformities of toe(s), unspecified, right foot: Secondary | ICD-10-CM

## 2023-07-18 DIAGNOSIS — M7751 Other enthesopathy of right foot: Secondary | ICD-10-CM | POA: Diagnosis not present

## 2023-07-18 NOTE — Progress Notes (Unsigned)
      Chief Complaint  Patient presents with   Toe Injury    4th toe on right foot was broken a few years ago but I didn't go to the doc and it has healed wrong and causing discomfort   HPI: 33 y.o. female presents today with c/o painful "bump" under the right 4th toe.  States she think she broke the toe a few years ago, but didn't have it checked out at the time.  Now, the toe sits a little higher than the other toes, and there is a painful bump under the 4th toe that pushes against the 5th toe.  It's getting progressively more painful as time goes on.  Past Medical History:  Diagnosis Date   Asthma    As a child    Past Surgical History:  Procedure Laterality Date   NO PAST SURGERIES      Allergies  Allergen Reactions   Penicillins     Physical Exam: Palpable pedal pulses right foot.  No edema.  There is elevation to the right 4th toe with the foot in rectus position.  Flexible PIPJ contracture noted.  Palpable bony prominence on plantarlateral aspect of 4th toe near PIPJ.  No skin lesion in area and no ulceration noted.  Epicritic sensation intact.  Radiographic Exam (right foot, 3 WB views, 07/18/23):  Normal osseous mineralization. There is evidence of prior fracture to the right 4th proximal phalanx near the distal neck of the bone.  It has healed in a slight dorsiflexed and medial position.  Medial oblique view revealed a bone spur protruding from the proximal phalanx where the fracture had been.  No foreign body noted.    Assessment/Plan of Care: 1. Acquired deformity of toe, right   2. Bone spur of toe of right foot   3. Injury of right toe, sequela    Discussed clinical and radiographic findings with patient today.  She can use lamb's wool to wrap around the 4th toe to try and pad the bony prominence, but she ultimately needs to have an arthroplasty of the PIPJ with resection of the associated spur.  Discussed this outpatient surgery that would be performed under IV  sedation and local anesthetic at the [facility].  Two - 2.5 week recovery time in a surgical shoe.  She'll need to rest and elevate/ice foot post-op, so needs to make arrangements regarding work scheduled to allow for this, so that there is minimal swelling/pain.    She can reach out to her PCP for any recommended changes in her daily medications around the peri-operative period.    Benefits, risks, and possible p/o complications were discussed with patient.  Verbal and written consent were obtained today.  All questions were answered.  Offered opportunity to reschedule to discuss further if she has additional questions.    F/u for surgery     Clerance Lav, DPM, FACFAS [facility]     [address] [place], [place] [address]                Office [phone]  Fax [phone]

## 2023-07-19 ENCOUNTER — Encounter: Payer: Self-pay | Admitting: Podiatry

## 2023-07-27 ENCOUNTER — Telehealth: Payer: Self-pay | Admitting: Podiatry

## 2023-07-27 NOTE — Telephone Encounter (Signed)
 DOS-08/22/23  HAMMER TOE REPAIR 4TH [id number]  [facility] EFFECTIVE DATE- 07/08/23  PER AN INCOMING FAX FROM [facility] [employer], PRIOR AUTH HAS BEEN APPROVED FOR CPT CODE 45409, GOOD FROM 08/22/23-11/22/23.  AUTH REFERENCE #: [id number]   [facility] EFFECTIVE DATE- 06/07/23  PER THE [employer] PORTAL, PRIOR AUTH HAS BEEN APPROVED FOR CPT CODE 95621, GOOD FROM 08/22/23-11/20/23.  AUTH REFERENCE #: [id number]

## 2023-08-17 ENCOUNTER — Telehealth: Payer: Self-pay | Admitting: Urology

## 2023-08-17 NOTE — Telephone Encounter (Signed)
 Pt called saying she needs to cxl sx on 08/22/23 with Dr. Burna Mortimer due to work. She will call back to reschedule when it works better with her work schedule. I have informed Aram Beecham with [facility] and Dr. Burna Mortimer of this change.

## 2023-08-29 ENCOUNTER — Encounter: Payer: 59 | Admitting: Podiatry

## 2023-09-05 ENCOUNTER — Encounter: Payer: 59 | Admitting: Podiatry

## 2023-09-19 ENCOUNTER — Encounter: Payer: 59 | Admitting: Podiatry
